# Patient Record
Sex: Male | Born: 1935 | Marital: Married | Smoking: Never smoker
Health system: Southern US, Community
[De-identification: ages and names within clinical notes are randomized; demographics above are authoritative.]

## PROBLEM LIST (undated history)

## (undated) DIAGNOSIS — Z9841 Cataract extraction status, right eye: Secondary | ICD-10-CM

## (undated) DIAGNOSIS — T8859XA Other complications of anesthesia, initial encounter: Secondary | ICD-10-CM

## (undated) DIAGNOSIS — N183 Chronic kidney disease, stage 3 unspecified: Secondary | ICD-10-CM

## (undated) DIAGNOSIS — K5792 Diverticulitis of intestine, part unspecified, without perforation or abscess without bleeding: Secondary | ICD-10-CM

## (undated) DIAGNOSIS — F0390 Unspecified dementia without behavioral disturbance: Secondary | ICD-10-CM

## (undated) DIAGNOSIS — I700 Atherosclerosis of aorta: Secondary | ICD-10-CM

## (undated) DIAGNOSIS — I714 Abdominal aortic aneurysm, without rupture, unspecified: Secondary | ICD-10-CM

## (undated) DIAGNOSIS — R112 Nausea with vomiting, unspecified: Secondary | ICD-10-CM

## (undated) DIAGNOSIS — Z9889 Other specified postprocedural states: Secondary | ICD-10-CM

## (undated) DIAGNOSIS — I739 Peripheral vascular disease, unspecified: Secondary | ICD-10-CM

## (undated) DIAGNOSIS — N289 Disorder of kidney and ureter, unspecified: Secondary | ICD-10-CM

## (undated) DIAGNOSIS — E785 Hyperlipidemia, unspecified: Secondary | ICD-10-CM

## (undated) DIAGNOSIS — N400 Enlarged prostate without lower urinary tract symptoms: Secondary | ICD-10-CM

## (undated) DIAGNOSIS — H9193 Unspecified hearing loss, bilateral: Secondary | ICD-10-CM

## (undated) DIAGNOSIS — A419 Sepsis, unspecified organism: Secondary | ICD-10-CM

## (undated) DIAGNOSIS — R339 Retention of urine, unspecified: Secondary | ICD-10-CM

## (undated) DIAGNOSIS — M109 Gout, unspecified: Secondary | ICD-10-CM

## (undated) DIAGNOSIS — I10 Essential (primary) hypertension: Secondary | ICD-10-CM

## (undated) HISTORY — PX: CATARACT EXTRACTION W/ INTRAOCULAR LENS IMPLANT: SHX1309

## (undated) HISTORY — PX: COARCTATION OF AORTA REPAIR: SHX261

---

## 2014-01-02 ENCOUNTER — Other Ambulatory Visit: Payer: Self-pay | Admitting: Family Medicine

## 2014-01-02 DIAGNOSIS — L97319 Non-pressure chronic ulcer of right ankle with unspecified severity: Secondary | ICD-10-CM

## 2014-01-08 ENCOUNTER — Ambulatory Visit
Admission: RE | Admit: 2014-01-08 | Discharge: 2014-01-08 | Disposition: A | Discharge status: Home / Self Care | Payer: Medicare HMO | Source: Ambulatory Visit | Attending: Family Medicine | Admitting: Family Medicine

## 2014-01-08 DIAGNOSIS — L97319 Non-pressure chronic ulcer of right ankle with unspecified severity: Secondary | ICD-10-CM

## 2014-01-23 ENCOUNTER — Encounter (HOSPITAL_BASED_OUTPATIENT_CLINIC_OR_DEPARTMENT_OTHER): Payer: Medicare HMO | Attending: General Surgery

## 2014-01-23 DIAGNOSIS — M109 Gout, unspecified: Secondary | ICD-10-CM | POA: Insufficient documentation

## 2014-01-23 DIAGNOSIS — L97319 Non-pressure chronic ulcer of right ankle with unspecified severity: Secondary | ICD-10-CM | POA: Diagnosis not present

## 2014-01-24 NOTE — H&P (Signed)
Adrian Aguilar:  Adrian Aguilar              ACCOUNT NO.:  [id number]  MEDICAL RECORD NO.:  [id number]  LOCATION:  FOOT                         FACILITY:  [facility]  PHYSICIAN:  Joanne Gaveloy Zivah Mayr, M.D.        DATE OF BIRTH:  04-20-1932  DATE OF ADMISSION:  01/23/2014 DATE OF DISCHARGE:                             HISTORY & PHYSICAL   CHIEF COMPLAINT:  Wound on right ankle.  HISTORY OF PRESENT ILLNESS:  This patient had a biopsy of a wound several months ago done by podiatrist and this has failed to heal.  PAST MEDICAL HISTORY:  Significant that the patient has gout and has probable peripheral vascular disease with nonpalpable pulses. Significant for hypertension, high cholesterol, epistaxis, gout, and erectile dysfunction.  PAST SURGICAL HISTORY:  None.  SOCIAL HISTORY:  Cigarettes none.  Alcohol none.  ALLERGIES:  None.  MEDICATIONS:  Lisinopril, hydrochlorothiazide, colchicine, allopurinol, which he has stopped taking.  REVIEW OF SYSTEMS:  As above.  PHYSICAL EXAMINATION:  VITAL SIGNS:  Temperature 97.7, pulse 72, respirations 16, blood pressure 151/60. GENERAL APPEARANCE:  Well developed, well nourished, in no distress. CHEST:  Clear. HEART:  Regular rhythm. EXTREMITIES:  Examination of the lower extremity reveals a lateral malleolus wound 1.3 x 1.3.  This wound is covered with an eschar.  After the eschar was removed, the patient has a tophus and a yellow liquid discharge, which was cultured and a piece of tissue was sent for biopsy.  IMPRESSION:  Tophaceous gout with ulcer, right lateral malleolus.  PLAN:  We will start with Santyl and Hydrogel.  See him in 7 days.     Joanne Gaveloy Elohim Brune, M.D.     RA/MEDQ  D:  01/23/2014  T:  01/24/2014  Job:  [id number]

## 2014-01-30 DIAGNOSIS — M109 Gout, unspecified: Secondary | ICD-10-CM | POA: Diagnosis not present

## 2014-01-30 DIAGNOSIS — L97319 Non-pressure chronic ulcer of right ankle with unspecified severity: Secondary | ICD-10-CM | POA: Diagnosis not present

## 2014-02-13 ENCOUNTER — Encounter (HOSPITAL_BASED_OUTPATIENT_CLINIC_OR_DEPARTMENT_OTHER): Payer: Medicare HMO | Attending: General Surgery

## 2014-03-28 DIAGNOSIS — I714 Abdominal aortic aneurysm, without rupture, unspecified: Secondary | ICD-10-CM | POA: Insufficient documentation

## 2014-03-28 HISTORY — PX: OTHER SURGICAL HISTORY: SHX169

## 2014-04-18 HISTORY — PX: FEMORAL-POPLITEAL BYPASS GRAFT: SHX937

## 2014-09-21 DIAGNOSIS — E785 Hyperlipidemia, unspecified: Secondary | ICD-10-CM | POA: Insufficient documentation

## 2017-12-10 DIAGNOSIS — H9193 Unspecified hearing loss, bilateral: Secondary | ICD-10-CM | POA: Insufficient documentation

## 2021-03-03 DIAGNOSIS — F03B0 Unspecified dementia, moderate, without behavioral disturbance, psychotic disturbance, mood disturbance, and anxiety: Secondary | ICD-10-CM | POA: Insufficient documentation

## 2023-04-07 DIAGNOSIS — K8011 Calculus of gallbladder with chronic cholecystitis with obstruction: Secondary | ICD-10-CM

## 2023-04-07 HISTORY — DX: Calculus of gallbladder with chronic cholecystitis with obstruction: K80.11

## 2023-04-17 ENCOUNTER — Emergency Department: Payer: Medicare Other

## 2023-04-17 ENCOUNTER — Inpatient Hospital Stay
Admission: EM | Admit: 2023-04-17 | Discharge: 2023-04-22 | DRG: 391 | Disposition: A | Discharge status: Skilled Nursing Facility | Payer: Medicare Other | Attending: Internal Medicine | Admitting: Internal Medicine

## 2023-04-17 ENCOUNTER — Other Ambulatory Visit: Payer: Self-pay

## 2023-04-17 DIAGNOSIS — N179 Acute kidney failure, unspecified: Secondary | ICD-10-CM | POA: Diagnosis not present

## 2023-04-17 DIAGNOSIS — R339 Retention of urine, unspecified: Secondary | ICD-10-CM | POA: Diagnosis not present

## 2023-04-17 DIAGNOSIS — R4182 Altered mental status, unspecified: Principal | ICD-10-CM | POA: Diagnosis present

## 2023-04-17 DIAGNOSIS — E871 Hypo-osmolality and hyponatremia: Secondary | ICD-10-CM | POA: Diagnosis present

## 2023-04-17 DIAGNOSIS — F0390 Unspecified dementia without behavioral disturbance: Secondary | ICD-10-CM | POA: Diagnosis present

## 2023-04-17 DIAGNOSIS — Z833 Family history of diabetes mellitus: Secondary | ICD-10-CM | POA: Diagnosis not present

## 2023-04-17 DIAGNOSIS — D72829 Elevated white blood cell count, unspecified: Secondary | ICD-10-CM | POA: Insufficient documentation

## 2023-04-17 DIAGNOSIS — Z1152 Encounter for screening for COVID-19: Secondary | ICD-10-CM

## 2023-04-17 DIAGNOSIS — I714 Abdominal aortic aneurysm, without rupture, unspecified: Secondary | ICD-10-CM | POA: Insufficient documentation

## 2023-04-17 DIAGNOSIS — Z87440 Personal history of urinary (tract) infections: Secondary | ICD-10-CM | POA: Diagnosis not present

## 2023-04-17 DIAGNOSIS — Z9841 Cataract extraction status, right eye: Secondary | ICD-10-CM

## 2023-04-17 DIAGNOSIS — G9341 Metabolic encephalopathy: Secondary | ICD-10-CM | POA: Diagnosis present

## 2023-04-17 DIAGNOSIS — I10 Essential (primary) hypertension: Secondary | ICD-10-CM | POA: Insufficient documentation

## 2023-04-17 DIAGNOSIS — I739 Peripheral vascular disease, unspecified: Secondary | ICD-10-CM | POA: Insufficient documentation

## 2023-04-17 DIAGNOSIS — Z7982 Long term (current) use of aspirin: Secondary | ICD-10-CM | POA: Diagnosis not present

## 2023-04-17 DIAGNOSIS — K5900 Constipation, unspecified: Secondary | ICD-10-CM | POA: Diagnosis not present

## 2023-04-17 DIAGNOSIS — Z95820 Peripheral vascular angioplasty status with implants and grafts: Secondary | ICD-10-CM | POA: Diagnosis not present

## 2023-04-17 DIAGNOSIS — N1832 Chronic kidney disease, stage 3b: Secondary | ICD-10-CM | POA: Insufficient documentation

## 2023-04-17 DIAGNOSIS — I129 Hypertensive chronic kidney disease with stage 1 through stage 4 chronic kidney disease, or unspecified chronic kidney disease: Secondary | ICD-10-CM | POA: Diagnosis present

## 2023-04-17 DIAGNOSIS — Z9842 Cataract extraction status, left eye: Secondary | ICD-10-CM

## 2023-04-17 DIAGNOSIS — Z961 Presence of intraocular lens: Secondary | ICD-10-CM | POA: Diagnosis present

## 2023-04-17 DIAGNOSIS — F03B0 Unspecified dementia, moderate, without behavioral disturbance, psychotic disturbance, mood disturbance, and anxiety: Secondary | ICD-10-CM | POA: Insufficient documentation

## 2023-04-17 DIAGNOSIS — Z79899 Other long term (current) drug therapy: Secondary | ICD-10-CM | POA: Diagnosis not present

## 2023-04-17 HISTORY — DX: Disorder of kidney and ureter, unspecified: N28.9

## 2023-04-17 HISTORY — DX: Abdominal aortic aneurysm, without rupture, unspecified: I71.40

## 2023-04-17 HISTORY — DX: Peripheral vascular disease, unspecified: I73.9

## 2023-04-17 HISTORY — DX: Essential (primary) hypertension: I10

## 2023-04-17 LAB — URINALYSIS, W/ REFLEX TO CULTURE (INFECTION SUSPECTED)
Bacteria, UA: NONE SEEN
Bilirubin Urine: NEGATIVE
Glucose, UA: NEGATIVE mg/dL
Hgb urine dipstick: NEGATIVE
Ketones, ur: NEGATIVE mg/dL
Leukocytes,Ua: NEGATIVE
Nitrite: NEGATIVE
Protein, ur: NEGATIVE mg/dL
Specific Gravity, Urine: 1.011 (ref 1.005–1.030)
Squamous Epithelial / HPF: 0 /[HPF] (ref 0–5)
pH: 5 (ref 5.0–8.0)

## 2023-04-17 LAB — COMPREHENSIVE METABOLIC PANEL
ALT: 21 U/L (ref 0–44)
AST: 27 U/L (ref 15–41)
Albumin: 4.8 g/dL (ref 3.5–5.0)
Alkaline Phosphatase: 85 U/L (ref 38–126)
Anion gap: 10 (ref 5–15)
BUN: 29 mg/dL — ABNORMAL HIGH (ref 8–23)
CO2: 22 mmol/L (ref 22–32)
Calcium: 9.7 mg/dL (ref 8.9–10.3)
Chloride: 99 mmol/L (ref 98–111)
Creatinine, Ser: 1.4 mg/dL — ABNORMAL HIGH (ref 0.61–1.24)
GFR, Estimated: 48 mL/min — ABNORMAL LOW (ref >60)
Glucose, Bld: 110 mg/dL — ABNORMAL HIGH (ref 70–99)
Potassium: 3.7 mmol/L (ref 3.5–5.1)
Sodium: 131 mmol/L — ABNORMAL LOW (ref 135–145)
Total Bilirubin: 0.9 mg/dL (ref 0.0–1.2)
Total Protein: 8.2 g/dL — ABNORMAL HIGH (ref 6.5–8.1)

## 2023-04-17 LAB — CBC WITH DIFFERENTIAL/PLATELET
Abs Immature Granulocytes: 0.08 10*3/uL — ABNORMAL HIGH (ref 0.00–0.07)
Basophils Absolute: 0.1 10*3/uL (ref 0.0–0.1)
Basophils Relative: 0 %
Eosinophils Absolute: 0.2 10*3/uL (ref 0.0–0.5)
Eosinophils Relative: 1 %
HCT: 41.4 % (ref 39.0–52.0)
Hemoglobin: 14.6 g/dL (ref 13.0–17.0)
Immature Granulocytes: 1 %
Lymphocytes Relative: 9 %
Lymphs Abs: 1.5 10*3/uL (ref 0.7–4.0)
MCH: 30 pg (ref 26.0–34.0)
MCHC: 35.3 g/dL (ref 30.0–36.0)
MCV: 85.2 fL (ref 80.0–100.0)
Monocytes Absolute: 1.1 10*3/uL — ABNORMAL HIGH (ref 0.1–1.0)
Monocytes Relative: 6 %
Neutro Abs: 13.8 10*3/uL — ABNORMAL HIGH (ref 1.7–7.7)
Neutrophils Relative %: 83 %
Platelets: 266 10*3/uL (ref 150–400)
RBC: 4.86 MIL/uL (ref 4.22–5.81)
RDW: 12.6 % (ref 11.5–15.5)
WBC: 16.6 10*3/uL — ABNORMAL HIGH (ref 4.0–10.5)
nRBC: 0 % (ref 0.0–0.2)

## 2023-04-17 LAB — LACTIC ACID, PLASMA
Lactic Acid, Venous: 1.7 mmol/L (ref 0.5–1.9)
Lactic Acid, Venous: 1.7 mmol/L (ref 0.5–1.9)

## 2023-04-17 LAB — PROTIME-INR
INR: 1 (ref 0.8–1.2)
Prothrombin Time: 13.8 s (ref 11.4–15.2)

## 2023-04-17 LAB — RESP PANEL BY RT-PCR (RSV, FLU A&B, COVID)  RVPGX2
Influenza A by PCR: NEGATIVE
Influenza B by PCR: NEGATIVE
Resp Syncytial Virus by PCR: NEGATIVE
SARS Coronavirus 2 by RT PCR: NEGATIVE

## 2023-04-17 LAB — PROCALCITONIN: Procalcitonin: <0.1 ng/mL

## 2023-04-17 MED ORDER — IOHEXOL 300 MG/ML  SOLN
80.0000 mL | Freq: Once | INTRAMUSCULAR | Status: AC | PRN
  Administered 2023-04-17: 80 mL via INTRAVENOUS

## 2023-04-17 MED ORDER — VANCOMYCIN HCL IN DEXTROSE 1-5 GM/200ML-% IV SOLN
1000.0000 mg | Freq: Once | INTRAVENOUS | Status: AC
  Administered 2023-04-17: 1000 mg via INTRAVENOUS
  Filled 2023-04-17: qty 200

## 2023-04-17 MED ORDER — MELATONIN 5 MG PO TABS
5.0000 mg | ORAL_TABLET | Freq: Every day | ORAL | Status: DC
  Administered 2023-04-17 – 2023-04-21 (×2): 5 mg via ORAL
  Filled 2023-04-17 (×4): qty 1

## 2023-04-17 MED ORDER — ACETAMINOPHEN 650 MG RE SUPP: 650.0000 mg | Freq: Four times a day (QID) | RECTAL | Status: DC | PRN

## 2023-04-17 MED ORDER — VANCOMYCIN HCL 750 MG IV SOLR
750.0000 mg | Freq: Once | INTRAVENOUS | Status: AC
  Administered 2023-04-17: 750 mg via INTRAVENOUS
  Filled 2023-04-17 (×2): qty 15

## 2023-04-17 MED ORDER — HEPARIN SODIUM (PORCINE) 5000 UNIT/ML IJ SOLN
5000.0000 [IU] | Freq: Three times a day (TID) | INTRAMUSCULAR | Status: DC
  Administered 2023-04-17 – 2023-04-22 (×14): 5000 [IU] via SUBCUTANEOUS
  Filled 2023-04-17 (×13): qty 1

## 2023-04-17 MED ORDER — ONDANSETRON HCL 4 MG PO TABS: 4.0000 mg | ORAL_TABLET | Freq: Four times a day (QID) | ORAL | Status: DC | PRN

## 2023-04-17 MED ORDER — ONDANSETRON HCL 4 MG/2ML IJ SOLN: 4.0000 mg | Freq: Four times a day (QID) | INTRAMUSCULAR | Status: DC | PRN

## 2023-04-17 MED ORDER — HYDRALAZINE HCL 20 MG/ML IJ SOLN
5.0000 mg | Freq: Four times a day (QID) | INTRAMUSCULAR | Status: DC | PRN
  Administered 2023-04-19 – 2023-04-20 (×3): 5 mg via INTRAVENOUS
  Filled 2023-04-17 (×3): qty 1

## 2023-04-17 MED ORDER — SODIUM CHLORIDE 0.9 % IV SOLN
2.0000 g | Freq: Two times a day (BID) | INTRAVENOUS | Status: DC
  Administered 2023-04-18: 2 g via INTRAVENOUS
  Filled 2023-04-17: qty 12.5

## 2023-04-17 MED ORDER — MAGNESIUM CITRATE PO SOLN
1.0000 | Freq: Once | ORAL | Status: AC
  Administered 2023-04-17: 1 via ORAL
  Filled 2023-04-17 (×2): qty 296

## 2023-04-17 MED ORDER — LIDOCAINE VISCOUS HCL 2 % MT SOLN
15.0000 mL | Freq: Once | OROMUCOSAL | Status: AC
  Administered 2023-04-17: 15 mL via OROMUCOSAL
  Filled 2023-04-17: qty 15

## 2023-04-17 MED ORDER — HALOPERIDOL LACTATE 5 MG/ML IJ SOLN: 2.0000 mg | Freq: Four times a day (QID) | INTRAMUSCULAR | Status: DC | PRN

## 2023-04-17 MED ORDER — METRONIDAZOLE 500 MG/100ML IV SOLN
500.0000 mg | Freq: Two times a day (BID) | INTRAVENOUS | Status: DC
  Administered 2023-04-17 – 2023-04-18 (×2): 500 mg via INTRAVENOUS
  Filled 2023-04-17 (×2): qty 100

## 2023-04-17 MED ORDER — LORAZEPAM 2 MG/ML IJ SOLN
0.5000 mg | Freq: Four times a day (QID) | INTRAMUSCULAR | Status: DC | PRN
  Administered 2023-04-17: 0.5 mg via INTRAVENOUS
  Filled 2023-04-17: qty 1

## 2023-04-17 MED ORDER — ACETAMINOPHEN 325 MG PO TABS: 650.0000 mg | ORAL_TABLET | Freq: Four times a day (QID) | ORAL | Status: DC | PRN

## 2023-04-17 MED ORDER — VANCOMYCIN HCL IN DEXTROSE 1-5 GM/200ML-% IV SOLN: 1000.0000 mg | INTRAVENOUS | Status: DC

## 2023-04-17 MED ORDER — LACTATED RINGERS IV BOLUS
1000.0000 mL | Freq: Once | INTRAVENOUS | Status: AC
  Administered 2023-04-17: 1000 mL via INTRAVENOUS

## 2023-04-17 MED ORDER — LORAZEPAM 2 MG/ML IJ SOLN
1.0000 mg | Freq: Once | INTRAMUSCULAR | Status: AC
  Administered 2023-04-17: 1 mg via INTRAVENOUS
  Filled 2023-04-17: qty 1

## 2023-04-17 MED ORDER — SODIUM CHLORIDE 0.9 % IV SOLN
2.0000 g | Freq: Once | INTRAVENOUS | Status: AC
  Administered 2023-04-17: 2 g via INTRAVENOUS
  Filled 2023-04-17: qty 12.5

## 2023-04-17 NOTE — ED Provider Notes (Signed)
 [facility] Provider Note    Event Date/Time   First MD Initiated Contact with Patient 04/17/23 1501     (approximate)   History   Altered Mental Status and Fall   HPI  Adrian Aguilar is a 89 y.o. male  who presents to the emergency department today because of concern for weakness, falls and altered mental status. Patient is unable to give any significant history. History is obtained from wife at bedside. She states that the patient had been in his normal state of health in the morning however then developed diarrhea. The wife went to the pharmacy to pick up immodium and when she came back she noticed he was weaker. Because of the weakness he fell two times. He did not pass out. The patient had not had any cough. No fevers. Wife says he was septic once in the past and also has history of urinary tract infections.     Physical Exam   Triage Vital Signs: ED Triage Vitals  Encounter Vitals Group     BP 04/17/23 1437 (!) 160/80     Systolic BP Percentile --      Diastolic BP Percentile --      Pulse Rate 04/17/23 1437 94     Resp 04/17/23 1437 (!) 26     Temp 04/17/23 1437 98 F (36.7 C)     Temp Source 04/17/23 1437 Oral     SpO2 04/17/23 1437 99 %     Weight 04/17/23 1437 183 lb (83 kg)     Height --      Head Circumference --      Peak Flow --      Pain Score 04/17/23 1428 0     Pain Loc --      Pain Education --      Exclude from Growth Chart --     Most recent vital signs: Vitals:   04/17/23 1437  BP: (!) 160/80  Pulse: 94  Resp: (!) 26  Temp: 98 F (36.7 C)  SpO2: 99%   General: Awake, alert, oriented. CV:  Good peripheral perfusion. Regular rate and rhythm Resp:  Normal effort. Tachypnea.  Abd:  Distended. Non tender.   ED Results / Procedures / Treatments   Labs (all labs ordered are listed, but only abnormal results are displayed) Labs Reviewed  COMPREHENSIVE METABOLIC PANEL - Abnormal; Notable for the following components:       Result Value   Sodium 131 (*)    Glucose, Bld 110 (*)    BUN 29 (*)    Creatinine, Ser 1.40 (*)    Total Protein 8.2 (*)    GFR, Estimated 48 (*)    All other components within normal limits  CBC WITH DIFFERENTIAL/PLATELET - Abnormal; Notable for the following components:   WBC 16.6 (*)    Neutro Abs 13.8 (*)    Monocytes Absolute 1.1 (*)    Abs Immature Granulocytes 0.08 (*)    All other components within normal limits  URINALYSIS, W/ REFLEX TO CULTURE (INFECTION SUSPECTED) - Abnormal; Notable for the following components:   Color, Urine YELLOW (*)    APPearance CLEAR (*)    All other components within normal limits  CULTURE, BLOOD (ROUTINE X 2)  CULTURE, BLOOD (ROUTINE X 2)  RESP PANEL BY RT-PCR (RSV, FLU A&B, COVID)  [id number]  LACTIC ACID, PLASMA  LACTIC ACID, PLASMA  PROTIME-INR  BASIC METABOLIC PANEL  CBC  PROCALCITONIN     EKG  LILLETTE Guadalupe Eagles, attending physician, personally viewed and interpreted this EKG  EKG Time: 1429 Rate: 95 Rhythm: sinus rhythm Axis: left axis deviation Intervals: qtc 450 QRS: LVH ST changes: no st elevation Impression: abnormal ekg   RADIOLOGY I independently interpreted and visualized the CXR. My interpretation: No pneumonia. Radiology interpretation:  IMPRESSION:  1. 8 mm density in the left lung base, possibly a true  intrapulmonary nodule. Chest CT is recommended for further  characterization.  2.  Aortic Atherosclerosis (ICD10-I70.0).   I independently interpreted and visualized the CT head/cervical spine. My interpretation: No ICH. No fracture Radiology interpretation:  IMPRESSION:  1. No acute intracranial findings.  2. Atrophy and white matter microvascular disease.  3. No cervical spine fracture.  4. Mild multilevel disc osteophytic disease.   I independently interpreted and visualized the CT abd/pel. My interpretation: No free air Radiology interpretation:  IMPRESSION:  1. Moderate stool distends the  rectum to 7.2 cm and transverse  diameter. There is some wall thickening and gas around the stool  ball without significant inflammatory change. Stool impaction may be  the source of the patient's pain.  2. Descending and sigmoid diverticulosis without diverticulitis.  3. Aortic bi-femoral stent graft in place.  4. Grade 1 degenerative anterolisthesis at L4-5.  5. Moderate facet hypertrophy bilaterally at L4-5 and L5-S1.  6.  Aortic Atherosclerosis (ICD10-I70.0).       PROCEDURES:  Critical Care performed: No   MEDICATIONS ORDERED IN ED: Medications - No data to display   IMPRESSION / MDM / ASSESSMENT AND PLAN / ED COURSE  I reviewed the triage vital signs and the nursing notes.                              Differential diagnosis includes, but is not limited to, dehydration, UTI, pneumonia  Patient's presentation is most consistent with acute presentation with potential threat to life or bodily function.   The patient is on the cardiac monitor to evaluate for evidence of arrhythmia and/or significant heart rate changes.  Patient presents to the emergency department today because of concern for weakness, falls and AMS. Patient unable to give history. On exam stomach was distended. Afebrile. Nursing staff performed bladder scan which revealed >700ccs. Did ask that foley catheter be placed. WBC elevated. Given concern for possible infection IVFs and abx were ordered.  CT head, cervical spine without any acute abnormalities.  Did obtain a CT abdomen pelvis which showed possible fecal impaction but no other concerning abnormalities.  I did attempt disimpaction however was not able to reach the stool at this time.  Given continued altered mental status I did discuss with Dr. Sherre with the hospitalist service for evaluation and admission.      FINAL CLINICAL IMPRESSION(S) / ED DIAGNOSES   Final diagnoses:  Altered mental status, unspecified altered mental status type      Note:  This document was prepared using Dragon voice recognition software and may include unintentional dictation errors.    Eagles Guadalupe, MD 04/17/23 1901

## 2023-04-17 NOTE — ED Notes (Signed)
 Collected Lactic acid at later time to allow Patient to obtain CT's and too allow the LR fluid bolus to complete.

## 2023-04-17 NOTE — ED Notes (Signed)
 Patient transported to CT

## 2023-04-17 NOTE — ED Triage Notes (Addendum)
 Pt to ED from home for fall and AMS via [facility] Diarrhea past 3 days. Had mechanical fall this AM per wife and did not hit head but did scratch R elbow.  EMS VS:CBG 157, 189f temporal, HR 104, RR 35, etco2 14, 222/114  Wife told EMS that pt very confused. Abdomen distended, tachypneic.   Triage VS: oral temp is 98.0, RR 24-26, 100% on RA, 160/80.   Oriented to self only. Unable to get good history from pt.    Obtained PMH from wife who is at bedside.

## 2023-04-17 NOTE — Progress Notes (Signed)
 Pharmacy Antibiotic Note  Adrian Aguilar is a 89 y.o. male admitted on 04/17/2023 with  an infection of unknown source .  On presentation, patient reported increased altered mental status and increased falling. In the ED, patient was afebrile, RR 26, HR 94, and BP 160/80. Lactic acid was 1.7. UA negative for leukocytes and nitrites. Pharmacy has been consulted for vancomycin  and cefepime  dosing x 7 days.  S/P Vancomycin  1 g IV x 1, metronidazole  500 mg IV x 1, and cefepime  2 g IV x 1  Plan: Give vancomycin  750 mg IV x1 to complete loading dose followed by 1000 mg IV Q24H. Goal AUC 400-550. Expected AUC: 469.3 Expected Css min: 12.8 SCr used: 1.4  Weight used: IBW, Vd used: 0.72 (BMI 26.3) Start cefepime  2 g IV Q12H Patient is also on metronidazole  500 mg IV Q12H Continue to monitor renal function and follow culture results   Weight: 83 kg (183 lb)  Temp (24hrs), Avg:98.4 F (36.9 C), Min:98 F (36.7 C), Max:98.7 F (37.1 C)  Recent Labs  Lab 04/17/23 1431 04/17/23 1738  WBC 16.6*  --   CREATININE 1.40*  --   LATICACIDVEN 1.7 1.7    CrCl cannot be calculated (Unknown ideal weight.).    Allergies  Allergen Reactions   Shellfish Allergy Other (See Comments)    According to wife. N/V only    Antimicrobials this admission: 1/11 Vancomycin  >>  1/11 Cefepime  >>  1/11 Metronidazole >>   Microbiology results: 1/11 BCx: IP   Thank you for allowing pharmacy to be a part of this patient's care.  Lum VEAR Mania, PharmD Clinical Pharmacist 04/17/2023 6:54 PM

## 2023-04-17 NOTE — Assessment & Plan Note (Addendum)
 Query obstipation, failed attempt at disimpaction per EDP 1 bottle of mag citrate solution ordered on admission AM team to consider enema if patient still does not have a bowel movement Strict I's and O's

## 2023-04-17 NOTE — Assessment & Plan Note (Signed)
 Without rupture

## 2023-04-17 NOTE — ED Notes (Signed)
 Patient attempting to climb out of bed. Patient more confused than when patient arrived at [facility] ED. Wife at bedside. Derrill Kay, MD notified.

## 2023-04-17 NOTE — Assessment & Plan Note (Signed)
 Etiology workup in progress Blood cultures x 2 are in process Check procalcitonin on admission Respiratory panel is in process at the time of this dictation Continue cefepime and vancomycin per pharmacy

## 2023-04-17 NOTE — Assessment & Plan Note (Signed)
 At baseline

## 2023-04-17 NOTE — ED Notes (Signed)
 Alerted Cox, DO, to patients increase aggravation and attempting to get out of the bed to pee, RN attempts to re-orient patient stating Mr. Formica you have a foley catheter in, you can go ahead and pee. Wife attempts to re-orient patient as well. Patient still trying to get out of the bed. See new orders.

## 2023-04-17 NOTE — Hospital Course (Signed)
 Mr. Delmus Warwick is a 89 year old male with history of CKD 3B, PAD status post aortic bifurcation stenting, hypertension, who presents emergency department for chief concerns of altered mental status and increasing fall.  Vitals in the ED showed temperature of 98, respiration rate 26, heart rate of 94, blood pressure 160/80, SpO2 99% on room air.  Serum sodium is 131, potassium 3.7, chloride 99, bicarb 22, BUN of 29, serum creatinine 1.40, EGFR 48, nonfasting blood glucose 110, WBC 16.6, hemoglobin 14.6, platelets of 266.  Lactic acid is 1.7 and on repeat was also 1.7.  UA was negative for leukocytes and nitrates.  ED treatment: Cefepime  and vancomycin  per pharmacy.  EDP attempted disimpaction and this was unsuccessful as the stool is unreachable.

## 2023-04-17 NOTE — Assessment & Plan Note (Signed)
 Suspect secondary to reactive Check cbc in the AM

## 2023-04-17 NOTE — Assessment & Plan Note (Signed)
 Hydralazine 5 mg IV every 6 hours as needed for SBP > 165, 5 days ordered

## 2023-04-17 NOTE — H&P (Signed)
 History and Physical   Adrian Aguilar:[id number] DOB: [birth year] DOA: 04/17/2023  PCP: Adrian Lamar Flavors, MD  Patient coming from: home via EMS  I have personally briefly reviewed patient's old medical records in [facility] EMR.  Chief Concern: Altered mental status  HPI: [name] is a 89 year old male with history of CKD 3B, PAD status post aortic bifurcation stenting, hypertension, who presents emergency department for chief concerns of altered mental status and increasing fall.  Vitals in the ED showed temperature of 98, respiration rate 26, heart rate of 94, blood pressure 160/80, SpO2 99% on room air.  Serum sodium is 131, potassium 3.7, chloride 99, bicarb 22, BUN of 29, serum creatinine 1.40, EGFR 48, nonfasting blood glucose 110, WBC 16.6, hemoglobin 14.6, platelets of 266.  Lactic acid is 1.7 and on repeat was also 1.7.  UA was negative for leukocytes and nitrates.  ED treatment: Cefepime  and vancomycin  per pharmacy.  EDP attempted disimpaction and this was unsuccessful as the stool is unreachable. ----------------------------------- At bedside, patient at able to tell me his name, birth day, and current location of hospital. He was not able to tell me his age.  Patient states that he is at bedside because the doctor thought he would be a good idea today.  Spouse at bedside states that patient does have worsening confusion at night.  She denies vomiting, fever, cough.  She reports that he did have a bowel movement this morning, and it was mostly loose/watery with some solid stools and there.  Social history: He lives at home with his wife.  He does not smoke or use tobacco products.  He does not have a history of recreational drug use.  He previously drank socially and he has not had an EtOH drink in about 3 years per spouse.  Patient is retired.  ROS: Unable to complete due to patient history of dementia and with altered mentation on admission  ED Course:  Discussed with the EDP, patient requiring hospitalization for chief concerns of altered mental status and following.  Assessment/Plan  Principal Problem:   Altered mental status Active Problems:   Essential hypertension   CKD stage 3b, GFR 30-44 ml/min (HCC)   Dementia (HCC)   Abdominal aortic aneurysm (AAA) (HCC)   PVD (peripheral vascular disease) (HCC)   Constipation   Leukocytosis   Assessment and Plan:  * Altered mental status Etiology workup in progress Blood cultures x 2 are in process Check procalcitonin on admission Respiratory panel is in process at the time of this dictation Continue cefepime  and vancomycin  per pharmacy  Leukocytosis Suspect secondary to reactive Check cbc in the AM  Constipation Query obstipation, failed attempt at disimpaction per EDP 1 bottle of mag citrate solution ordered on admission AM team to consider enema if patient still does not have a bowel movement Strict I's and O's  PVD (peripheral vascular disease) (HCC) Status post aortic bifurcation stenting  Abdominal aortic aneurysm (AAA) (HCC) Without rupture  CKD stage 3b, GFR 30-44 ml/min (HCC) At baseline  Essential hypertension Hydralazine  5 mg IV every 6 hours as needed for SBP > 165, 5 days ordered  Chart reviewed.   DVT prophylaxis: Heparin  5000 units subcutaneous every 8 hours Code Status: full code Diet: Number Family Communication: Updated spouse at bedside Disposition Plan: Pending clinical course Consults called: None at this time Admission status: Telemetry medical, inpatient  Past Medical History:  Diagnosis Date   AAA (abdominal aortic aneurysm) (HCC)  Hypertension    PAD (peripheral artery disease) (HCC)    PAD with stent placed to artery in R leg   Renal disorder    stage 3 CKD   Past Surgical History:  Procedure Laterality Date   CATARACT EXTRACTION W/ INTRAOCULAR LENS IMPLANT Bilateral    Social History:  reports that he has never smoked. He has  never used smokeless tobacco. He reports that he does not currently use alcohol. He reports that he does not use drugs.  Allergies  Allergen Reactions   Meat Extract Itching, Other (See Comments) and Rash    Red meat   Rosuvastatin Other (See Comments)    SEVERE LEG PAIN   Allopurinol Other (See Comments)    Lethargy   Propofol  Other (See Comments)    Other   Shellfish Allergy Other (See Comments)    According to wife. N/V only   Family History  Problem Relation Age of Onset   Diabetes Brother     Family history: Family history reviewed and not pertinent.  Prior to Admission medications   Medication Sig Start Date End Date Taking? Authorizing Provider  aspirin EC 81 MG tablet Take 81 mg by mouth daily. Swallow whole.   Yes [provider]  colchicine  0.6 MG tablet Take 0.6 mg by mouth daily.   Yes [provider]  febuxostat  (ULORIC ) 40 MG tablet Take 40 mg by mouth daily.   Yes [provider]  lisinopril  (ZESTRIL ) 20 MG tablet Take 20 mg by mouth daily.   Yes [provider]   Physical Exam: Vitals:   04/17/23 1437 04/17/23 1554 04/17/23 1828 04/17/23 1830  BP: (!) 160/80 (!) 179/79  (!) 167/60  Pulse: 94 82  68  Resp: (!) 26 (!) 22  16  Temp: 98 F (36.7 C)  98.7 F (37.1 C)   TempSrc: Oral  Oral   SpO2: 99% 100%  97%  Weight: 83 kg      Constitutional: appears age-appropriate, frail Eyes: PERRL, lids and conjunctivae normal ENMT: Mucous membranes are moist. Posterior pharynx clear of any exudate or lesions. Age-appropriate dentition. Hearing appropriate Neck: normal, supple, no masses, no thyromegaly Respiratory: clear to auscultation bilaterally, no wheezing, no crackles. Normal respiratory effort. No accessory muscle use.  Cardiovascular: Regular rate and rhythm, no murmurs / rubs / gallops. No extremity edema. 2+ pedal pulses. No carotid bruits.  Abdomen: Obese abdomen, no tenderness, no masses palpated, no hepatosplenomegaly.  Bowel sounds positive.  Musculoskeletal: no clubbing / cyanosis. No joint deformity upper and lower extremities. Good ROM, no contractures, no atrophy. Normal muscle tone.  Skin: no rashes, lesions, ulcers. No induration Neurologic: Sensation intact. Strength 5/5 in all 4.  Psychiatric: Normal judgment and insight. Alert and oriented x 3. Normal mood.   EKG: independently reviewed, showing sinus rhythm with rate of 95, QTc 450  Chest x-ray on Admission: I personally reviewed and I agree with radiologist reading as below.  CT ABDOMEN PELVIS W CONTRAST Result Date: 04/17/2023 CLINICAL DATA:  Diarrhea.  Abdominal pain. EXAM: CT ABDOMEN AND PELVIS WITH CONTRAST TECHNIQUE: Multidetector CT imaging of the abdomen and pelvis was performed using the standard protocol following bolus administration of intravenous contrast. RADIATION DOSE REDUCTION: This exam was performed according to the departmental dose-optimization program which includes automated exposure control, adjustment of the mA and/or kV according to patient size and/or use of iterative reconstruction technique. CONTRAST:  80mL OMNIPAQUE  IOHEXOL  300 MG/ML  SOLN COMPARISON:  None Available. FINDINGS: Lower chest: Mild  dependent atelectasis is present both lungs. Lungs otherwise clear. The heart size is normal. Artery calcifications are present. No significant pleural or pericardial effusion is present. Hepatobiliary: No focal liver abnormality is seen. No gallstones, gallbladder wall thickening, or biliary dilatation. Pancreas: Unremarkable. No pancreatic ductal dilatation or surrounding inflammatory changes. Spleen: Normal in size without focal abnormality. Adrenals/Urinary Tract: The adrenal glands are normal bilaterally. A simple cyst at the upper pole of the right kidney measures 3.2 cm. A posterior simple cyst measures 10 mm. Smaller cysts are present at the lower pole of the left kidney. No follow-up imaging is recommended no stone or solid mass  lesion is present. No obstruction is present. The ureters are within normal limits. A Foley catheter is present within the urinary bladder. Stomach/Bowel: The stomach and duodenum are within normal limits. Small bowel is unremarkable. Terminal ileum is within normal limits. The appendix is visualized and normal. The ascending and transverse colon are normal. Diverticular changes are present in the distal descending and at the sigmoid colon. No focal inflammatory changes are present to suggest diverticulitis. Moderate stool distends the rectum to 7.2 cm and transverse diameter. There is some wall thickening and gas around the stool ball without significant inflammatory change. No proximal obstruction is present. Vascular/Lymphatic: An aortic bi-femoral stent graft is in place. Atherosclerotic calcifications are present in the more proximal aorta and branch vessels. Calcifications are present in the external iliac arteries and femoral arteries. 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 Reproductive: Prostate is unremarkable. Other: No abdominal wall hernia or abnormality. No abdominopelvic ascites. Musculoskeletal: Grade 1 degenerative anterolisthesis is present at L4-5. Moderate facet hypertrophy is present bilaterally at L4-5 and L5-S1. No focal osseous lesions are present. The bony pelvis is normal. The hips are located and normal bilaterally. IMPRESSION: 1. Moderate stool distends the rectum to 7.2 cm and transverse diameter. There is some wall thickening and gas around the stool ball without significant inflammatory change. Stool impaction may be the source of the patient's pain. 2. Descending and sigmoid diverticulosis without diverticulitis. 3. Aortic bi-femoral stent graft in place. 4. Grade 1 degenerative anterolisthesis at L4-5. 5. Moderate facet hypertrophy bilaterally at L4-5 and L5-S1. 6.  Aortic Atherosclerosis (ICD10-I70.0). Electronically Signed   By: Lonni Necessary M.D.   On: 04/17/2023 17:37   CT Head Wo  Contrast Result Date: 04/17/2023 CLINICAL DATA:  Altered mental status.  Fall EXAM: CT HEAD WITHOUT CONTRAST CT CERVICAL SPINE WITHOUT CONTRAST TECHNIQUE: Multidetector CT imaging of the head and cervical spine was performed following the standard protocol without intravenous contrast. Multiplanar CT image reconstructions of the cervical spine were also generated. RADIATION DOSE REDUCTION: This exam was performed according to the departmental dose-optimization program which includes automated exposure control, adjustment of the mA and/or kV according to patient size and/or use of iterative reconstruction technique. COMPARISON:  None Available. FINDINGS: CT HEAD FINDINGS Brain: No acute intracranial hemorrhage. No focal mass lesion. No CT evidence of acute infarction. No midline shift or mass effect. No hydrocephalus. Basilar cisterns are patent. There are periventricular and subcortical white matter hypodensities. Generalized cortical atrophy. Vascular: No hyperdense vessel or unexpected calcification. Skull: Normal. Negative for fracture or focal lesion. Sinuses/Orbits: Paranasal sinuses and mastoid air cells are clear. Orbits are clear. Other: None. CT CERVICAL SPINE FINDINGS Alignment: Normal alignment of the cervical vertebral bodies. Skull base and vertebrae: Normal craniocervical junction. No loss of vertebral body height or disc height. Normal facet articulation. No evidence of fracture. Soft tissues and spinal canal: No prevertebral  soft tissue swelling. No perispinal or epidural hematoma. Disc levels: Anterior osteophytes from C5 to C7 with mild disc space narrowing. No acute findings. Upper chest: Clear Other: None IMPRESSION: 1. No acute intracranial findings. 2. Atrophy and white matter microvascular disease. 3. No cervical spine fracture. 4. Mild multilevel disc osteophytic disease. Electronically Signed   By: Jackquline Boxer M.D.   On: 04/17/2023 16:23   CT Cervical Spine Wo Contrast Result Date:  04/17/2023 CLINICAL DATA:  Altered mental status.  Fall EXAM: CT HEAD WITHOUT CONTRAST CT CERVICAL SPINE WITHOUT CONTRAST TECHNIQUE: Multidetector CT imaging of the head and cervical spine was performed following the standard protocol without intravenous contrast. Multiplanar CT image reconstructions of the cervical spine were also generated. RADIATION DOSE REDUCTION: This exam was performed according to the departmental dose-optimization program which includes automated exposure control, adjustment of the mA and/or kV according to patient size and/or use of iterative reconstruction technique. COMPARISON:  None Available. FINDINGS: CT HEAD FINDINGS Brain: No acute intracranial hemorrhage. No focal mass lesion. No CT evidence of acute infarction. No midline shift or mass effect. No hydrocephalus. Basilar cisterns are patent. There are periventricular and subcortical white matter hypodensities. Generalized cortical atrophy. Vascular: No hyperdense vessel or unexpected calcification. Skull: Normal. Negative for fracture or focal lesion. Sinuses/Orbits: Paranasal sinuses and mastoid air cells are clear. Orbits are clear. Other: None. CT CERVICAL SPINE FINDINGS Alignment: Normal alignment of the cervical vertebral bodies. Skull base and vertebrae: Normal craniocervical junction. No loss of vertebral body height or disc height. Normal facet articulation. No evidence of fracture. Soft tissues and spinal canal: No prevertebral soft tissue swelling. No perispinal or epidural hematoma. Disc levels: Anterior osteophytes from C5 to C7 with mild disc space narrowing. No acute findings. Upper chest: Clear Other: None IMPRESSION: 1. No acute intracranial findings. 2. Atrophy and white matter microvascular disease. 3. No cervical spine fracture. 4. Mild multilevel disc osteophytic disease. Electronically Signed   By: Jackquline Boxer M.D.   On: 04/17/2023 16:23   DG Chest Port 1 View Result Date: 04/17/2023 CLINICAL DATA:   Sepsis, altered mental status EXAM: PORTABLE CHEST 1 VIEW COMPARISON:  None Available. FINDINGS: Atherosclerotic calcification of the aortic arch. The overlap of the left posterior seventh rib and inferior scapular tip, an 8 mm density is present and a true intrapulmonary nodule is not excluded. Chest CT is recommended for further characterization. The lungs appear otherwise clear. Cardiac and mediastinal margins appear normal. IMPRESSION: 1. 8 mm density in the left lung base, possibly a true intrapulmonary nodule. Chest CT is recommended for further characterization. 2.  Aortic Atherosclerosis (ICD10-I70.0). Electronically Signed   By: Ryan Salvage M.D.   On: 04/17/2023 15:47   Labs on Admission: I have personally reviewed following labs  CBC: Recent Labs  Lab 04/17/23 1431  WBC 16.6*  NEUTROABS 13.8*  HGB 14.6  HCT 41.4  MCV 85.2  PLT 266   Basic Metabolic Panel: Recent Labs  Lab 04/17/23 1431  NA 131*  K 3.7  CL 99  CO2 22  GLUCOSE 110*  BUN 29*  CREATININE 1.40*  CALCIUM 9.7   GFR: CrCl cannot be calculated (Unknown ideal weight.).  Liver Function Tests: Recent Labs  Lab 04/17/23 1431  AST 27  ALT 21  ALKPHOS 85  BILITOT 0.9  PROT 8.2*  ALBUMIN 4.8   Coagulation Profile: Recent Labs  Lab 04/17/23 1431  INR 1.0   Urine analysis:    Component Value Date/Time   COLORURINE YELLOW (  A) 04/17/2023 1448   APPEARANCEUR CLEAR (A) 04/17/2023 1448   LABSPEC 1.011 04/17/2023 1448   PHURINE 5.0 04/17/2023 1448   GLUCOSEU NEGATIVE 04/17/2023 1448   HGBUR NEGATIVE 04/17/2023 1448   BILIRUBINUR NEGATIVE 04/17/2023 1448   KETONESUR NEGATIVE 04/17/2023 1448   PROTEINUR NEGATIVE 04/17/2023 1448   NITRITE NEGATIVE 04/17/2023 1448   LEUKOCYTESUR NEGATIVE 04/17/2023 1448   This document was prepared using Dragon Voice Recognition software and may include unintentional dictation errors.  Dr. Sherre [facility]  If 7PM-7AM, please contact overnight-coverage  provider If 7AM-7PM, please contact day attending provider [email or web address]  04/17/2023, 7:17 PM

## 2023-04-17 NOTE — ED Notes (Signed)
 Cox, DO in room now

## 2023-04-17 NOTE — ED Notes (Signed)
 MD in room attempting dis-impaction.

## 2023-04-17 NOTE — Assessment & Plan Note (Signed)
 Status post aortic bifurcation stenting

## 2023-04-18 DIAGNOSIS — D72829 Elevated white blood cell count, unspecified: Secondary | ICD-10-CM | POA: Diagnosis not present

## 2023-04-18 DIAGNOSIS — K5900 Constipation, unspecified: Secondary | ICD-10-CM

## 2023-04-18 DIAGNOSIS — R4182 Altered mental status, unspecified: Secondary | ICD-10-CM | POA: Diagnosis not present

## 2023-04-18 LAB — CBC
HCT: 38.9 % — ABNORMAL LOW (ref 39.0–52.0)
Hemoglobin: 13.4 g/dL (ref 13.0–17.0)
MCH: 29.2 pg (ref 26.0–34.0)
MCHC: 34.4 g/dL (ref 30.0–36.0)
MCV: 84.7 fL (ref 80.0–100.0)
Platelets: 242 10*3/uL (ref 150–400)
RBC: 4.59 MIL/uL (ref 4.22–5.81)
RDW: 12.8 % (ref 11.5–15.5)
WBC: 9.1 10*3/uL (ref 4.0–10.5)
nRBC: 0 % (ref 0.0–0.2)

## 2023-04-18 LAB — BASIC METABOLIC PANEL
Anion gap: 7 (ref 5–15)
BUN: 26 mg/dL — ABNORMAL HIGH (ref 8–23)
CO2: 24 mmol/L (ref 22–32)
Calcium: 9.4 mg/dL (ref 8.9–10.3)
Chloride: 106 mmol/L (ref 98–111)
Creatinine, Ser: 1.41 mg/dL — ABNORMAL HIGH (ref 0.61–1.24)
GFR, Estimated: 48 mL/min — ABNORMAL LOW (ref >60)
Glucose, Bld: 106 mg/dL — ABNORMAL HIGH (ref 70–99)
Potassium: 3.5 mmol/L (ref 3.5–5.1)
Sodium: 137 mmol/L (ref 135–145)

## 2023-04-18 MED ORDER — BISACODYL 10 MG RE SUPP
10.0000 mg | Freq: Every day | RECTAL | Status: DC | PRN
  Administered 2023-04-18: 10 mg via RECTAL
  Filled 2023-04-18 (×2): qty 1

## 2023-04-18 MED ORDER — POLYETHYLENE GLYCOL 3350 17 G PO PACK: 17.0000 g | PACK | Freq: Every day | ORAL | 0 refills | Status: DC

## 2023-04-18 MED ORDER — LORAZEPAM 2 MG/ML IJ SOLN
2.0000 mg | Freq: Once | INTRAMUSCULAR | Status: AC
  Administered 2023-04-18: 2 mg via INTRAVENOUS
  Filled 2023-04-18: qty 1

## 2023-04-18 MED ORDER — POTASSIUM CHLORIDE CRYS ER 20 MEQ PO TBCR
40.0000 meq | EXTENDED_RELEASE_TABLET | Freq: Once | ORAL | Status: AC
Start: 2023-04-18 — End: 2023-04-18
  Administered 2023-04-18: 40 meq via ORAL
  Filled 2023-04-18: qty 2

## 2023-04-18 MED ORDER — LORAZEPAM 2 MG/ML IJ SOLN
1.0000 mg | Freq: Once | INTRAMUSCULAR | Status: AC
  Administered 2023-04-18: 1 mg via INTRAVENOUS
  Filled 2023-04-18: qty 1

## 2023-04-18 MED ORDER — HALOPERIDOL LACTATE 5 MG/ML IJ SOLN
2.0000 mg | Freq: Four times a day (QID) | INTRAMUSCULAR | Status: DC | PRN
  Administered 2023-04-18 (×2): 2 mg via INTRAVENOUS
  Filled 2023-04-18 (×2): qty 1

## 2023-04-18 MED ORDER — LORAZEPAM 2 MG/ML IJ SOLN
1.0000 mg | INTRAMUSCULAR | Status: DC | PRN
  Administered 2023-04-18 – 2023-04-19 (×2): 1 mg via INTRAVENOUS
  Filled 2023-04-18 (×2): qty 1

## 2023-04-18 MED ORDER — LISINOPRIL 20 MG PO TABS
20.0000 mg | ORAL_TABLET | Freq: Every day | ORAL | Status: DC
  Administered 2023-04-18 – 2023-04-20 (×3): 20 mg via ORAL
  Filled 2023-04-18: qty 2
  Filled 2023-04-18: qty 1
  Filled 2023-04-18: qty 2

## 2023-04-18 MED ORDER — FEBUXOSTAT 40 MG PO TABS
40.0000 mg | ORAL_TABLET | Freq: Every day | ORAL | Status: DC
  Administered 2023-04-18 – 2023-04-22 (×5): 40 mg via ORAL
  Filled 2023-04-18 (×5): qty 1

## 2023-04-18 MED ORDER — COLCHICINE 0.6 MG PO TABS
0.6000 mg | ORAL_TABLET | Freq: Every day | ORAL | Status: DC
  Administered 2023-04-18 – 2023-04-20 (×3): 0.6 mg via ORAL
  Filled 2023-04-18 (×3): qty 1

## 2023-04-18 MED ORDER — DOCUSATE SODIUM 100 MG PO CAPS
100.0000 mg | ORAL_CAPSULE | Freq: Two times a day (BID) | ORAL | Status: DC
  Administered 2023-04-18 – 2023-04-22 (×5): 100 mg via ORAL
  Filled 2023-04-18 (×6): qty 1

## 2023-04-18 MED ORDER — SENNOSIDES-DOCUSATE SODIUM 8.6-50 MG PO TABS: 2.0000 | ORAL_TABLET | Freq: Every day | ORAL | 2 refills | Status: DC

## 2023-04-18 NOTE — ED Notes (Signed)
 Patient attempting to get out of the bed and fighting with staff and wife. Messaged MD about ativan.

## 2023-04-18 NOTE — ED Notes (Signed)
 Patient still attempting to get out of bed with staff and wife cues. MD notified.

## 2023-04-18 NOTE — ED Notes (Signed)
 Pt continues to sit on bedside commode, has had several small BM. Commode emptied and cleaned.

## 2023-04-18 NOTE — ED Notes (Signed)
 Pt wife stating pt has had a little blood come from penis after foley removal, wife informed this is normal due to some trauma that may have happened with insertion. Pt has yet to void. Pt moving from bed to commode with wife's help.

## 2023-04-18 NOTE — Progress Notes (Signed)
 PT Cancellation Note  Patient Details Name: Adrian Aguilar MRN: [id number] DOB: [birth year]   Cancelled Treatment:    Reason Eval/Treat Not Completed: Fatigue/lethargy limiting ability to participate;Patient's level of consciousness (Author arrives to ED8 for PT evlauation, wife at bedside, pt is asleep.) Per wife and ED staff, pt has been very anxious, impulsive regarding frequent bowel urgency today, has has several BM since ealier enema. Wife/staff both endorse a significant degree of unsteadiness on feet. Baseline collected from wife who reports no difficulty with basic mobility or balance normally. Pt was in a state of agitation earlier, difficult to calm or redirect, has since received anxiolytics and is now sleeping. Pt does not alert during 10 minute visit. We decide not to wake patient given his recent agitation and now heavily sedated state, as it would likely make his unsteadiness and safety issues worse. Will attempt evaluation again next date if possible, wife reports mornings are better for cognition.   4:30 PM, 04/18/23 [name], PT, DPT Physical Therapist - [facility]  [phone] (ASCOM)    [name] 04/18/2023, 4:27 PM

## 2023-04-18 NOTE — ED Notes (Signed)
Pt given saltines and apple juice

## 2023-04-18 NOTE — ED Notes (Signed)
 Pt cleaned up after a bowel movement and new brief applied

## 2023-04-18 NOTE — ED Notes (Signed)
 Pt wife coming out of room asking for help getting pt on bedside commode. States he was given an enema and is ready to have BM. Pt placed on bedside commode. Pt unsteady on feet. Pt and wife informed to call out when ready to get back in bed.

## 2023-04-18 NOTE — ED Notes (Signed)
 Re-ordered Ativan order. Patient combative with staff and wife. MD notified

## 2023-04-18 NOTE — Progress Notes (Signed)
 [facility] PROGRESS NOTE   Adrian Aguilar FMW:[id number] DOB: [birth year] DOA: 04/17/2023  PCP: Lemon Lamar Flavors, MD  Brief History: 89 year old male with history of CKD 3B, PAD status post aortic bifurcation stenting, hypertension, who presents emergency department for chief concerns of altered mental status and increasing fall.  Infectious workup was unremarkable.  Patient was empirically placed on antibacterials.  He was also complaining of abdominal pain which prompted a CT scan which showed severe constipation.  Consultants: None  Procedures: None    Subjective/Interval History: Patient pleasantly confused.  His wife is at the bedside.  He has had several bowel movements since last night.  Does not appear to be in any discomfort or distress.  He denies any pain currently.  Wife is very much interested in taking him home this afternoon.    Assessment/Plan:  Acute metabolic encephalopathy in the setting of known dementia Likely brought on by severe constipation.  No other etiology has been found.  CT scan of the head did not show any acute findings.  He has had several bowel movements overnight and has experienced an improvement in his mentation per his wife.  Could be back to his baseline now.  No obvious focal neurological deficits.  Leukocytosis Likely reactive.  Empirically started on antibiotics.  However procalcitonin less than 0.1.  Will discontinue antibiotics.  COVID-19 influenza and RSV PCR's were negative.  Severe constipation CT scan shows severe constipation.  Manual disimpaction was unsuccessful.  Was given a bottle of mag citrate overnight with which she has had multiple BMs.  Abdomen is benign on examination.  Can be further addressed in the outpatient setting.  Will be discharged on laxatives and stool softeners if he does go home later today.  Will check a TSH tomorrow morning if he is still here.  History of peripheral artery disease/abdominal aortic  aneurysm. Stable.  Chronic kidney disease stage IIIb Stable.  Essential hypertension Stable.  Supplement potassium.  Foley catheter This was placed when he was brought in with altered mental status.  Voiding trial to be done prior to discharge.   DVT Prophylaxis: Subcutaneous heparin  Code Status: Full code Family Communication: Discussed with patient Disposition Plan: Hopefully return home this afternoon as per his wife's wishes      Medications: Scheduled:  docusate sodium   100 mg Oral BID   heparin   5,000 Units Subcutaneous Q8H   melatonin  5 mg Oral QHS   Continuous: PRN:acetaminophen  **OR** acetaminophen , bisacodyl , haloperidol  lactate, hydrALAZINE , LORazepam , ondansetron  **OR** ondansetron  (ZOFRAN ) IV  Antibiotics: Anti-infectives (From admission, onward)    Start     Dose/Rate Route Frequency Ordered Stop   04/18/23 2000  vancomycin  (VANCOCIN ) IVPB 1000 mg/200 mL premix  Status:  Discontinued       Placed in Followed by Linked Group   1,000 mg 200 mL/hr over 60 Minutes Intravenous Every 24 hours 04/17/23 1903 04/18/23 1049   04/18/23 0400  ceFEPIme  (MAXIPIME ) 2 g in sodium chloride  0.9 % 100 mL IVPB  Status:  Discontinued        2 g 200 mL/hr over 30 Minutes Intravenous Every 12 hours 04/17/23 1903 04/18/23 1049   04/17/23 2000  vancomycin  (VANCOCIN ) 750 mg in sodium chloride  0.9 % 250 mL IVPB       Placed in Followed by Linked Group   750 mg 250 mL/hr over 60 Minutes Intravenous  Once 04/17/23 1903 04/17/23 2102   04/17/23 1845  metroNIDAZOLE  (FLAGYL ) IVPB 500 mg  Status:  Discontinued        500 mg 100 mL/hr over 60 Minutes Intravenous Every 12 hours 04/17/23 1842 04/18/23 1049   04/17/23 1530  vancomycin  (VANCOCIN ) IVPB 1000 mg/200 mL premix        1,000 mg 200 mL/hr over 60 Minutes Intravenous  Once 04/17/23 1529 04/17/23 1804   04/17/23 1530  ceFEPIme  (MAXIPIME ) 2 g in sodium chloride  0.9 % 100 mL IVPB        2 g 200 mL/hr over 30 Minutes  Intravenous  Once 04/17/23 1529 04/17/23 1640       Objective:  Vital Signs  Vitals:   04/18/23 0500 04/18/23 0530 04/18/23 0630 04/18/23 0730  BP: (!) 138/58 (!) 153/79 (!) 111/45 (!) 139/119  Pulse: 61 72 (!) 58 (!) 162  Resp: 16 20 19  (!) 21  Temp:    98 F (36.7 C)  TempSrc:    Oral  SpO2: 100% 97% 99% 100%  Weight:        Intake/Output Summary (Last 24 hours) at 04/18/2023 1119 Last data filed at 04/18/2023 0400 Gross per 24 hour  Intake --  Output 3990 ml  Net -3990 ml   Filed Weights   04/17/23 1437  Weight: 83 kg    General appearance: Awake alert.  In no distress.  Pleasantly confused Resp: Clear to auscultation bilaterally.  Normal effort Cardio: S1-S2 is normal regular.  No S3-S4.  No rubs murmurs or bruit GI: Abdomen is soft.  Nontender nondistended.  Bowel sounds are present normal.  No masses organomegaly Extremities: No edema.  Full range of motion of lower extremities. Neurologic:   No focal neurological deficits.    Lab Results:  Data Reviewed: I have personally reviewed following labs and reports of the imaging studies  CBC: Recent Labs  Lab 04/17/23 1431 04/18/23 0312  WBC 16.6* 9.1  NEUTROABS 13.8*  --   HGB 14.6 13.4  HCT 41.4 38.9*  MCV 85.2 84.7  PLT 266 242    Basic Metabolic Panel: Recent Labs  Lab 04/17/23 1431 04/18/23 0312  NA 131* 137  K 3.7 3.5  CL 99 106  CO2 22 24  GLUCOSE 110* 106*  BUN 29* 26*  CREATININE 1.40* 1.41*  CALCIUM 9.7 9.4    GFR: CrCl cannot be calculated (Unknown ideal weight.).  Liver Function Tests: Recent Labs  Lab 04/17/23 1431  AST 27  ALT 21  ALKPHOS 85  BILITOT 0.9  PROT 8.2*  ALBUMIN 4.8    Coagulation Profile: Recent Labs  Lab 04/17/23 1431  INR 1.0     Recent Results (from the past 240 hours)  Culture, blood (Routine x 2)     Status: None (Preliminary result)   Collection Time: 04/17/23  2:36 PM   Specimen: BLOOD  Result Value Ref Range Status   Specimen  Description BLOOD RIGHT ANTECUBITAL  Final   Special Requests   Final    BOTTLES DRAWN AEROBIC AND ANAEROBIC Blood Culture adequate volume   Culture   Final    NO ORGANISMS SEEN Performed at [facility], [address] [place], [place] [address]    Report Status PENDING  Incomplete  Culture, blood (Routine x 2)     Status: None (Preliminary result)   Collection Time: 04/17/23  2:40 PM   Specimen: BLOOD  Result Value Ref Range Status   Specimen Description BLOOD LAC  Final   Special Requests   Final    BOTTLES DRAWN AEROBIC AND ANAEROBIC Blood Culture  adequate volume   Culture   Final    NO GROWTH < 24 HOURS Performed at [facility], [address] [place], [place] [address]    Report Status PENDING  Incomplete  Resp panel by RT-PCR (RSV, Flu A&B, Covid) Anterior Nasal Swab     Status: None   Collection Time: 04/17/23  6:08 PM   Specimen: Anterior Nasal Swab  Result Value Ref Range Status   SARS Coronavirus 2 by RT PCR NEGATIVE NEGATIVE Final    Comment: (NOTE) SARS-CoV-2 target nucleic acids are NOT DETECTED.  The SARS-CoV-2 RNA is generally detectable in upper respiratory specimens during the acute phase of infection. The lowest concentration of SARS-CoV-2 viral copies this assay can detect is 138 copies/mL. A negative result does not preclude SARS-Cov-2 infection and should not be used as the sole basis for treatment or other patient management decisions. A negative result may occur with  improper specimen collection/handling, submission of specimen other than nasopharyngeal swab, presence of viral mutation(s) within the areas targeted by this assay, and inadequate number of viral copies(<138 copies/mL). A negative result must be combined with clinical observations, patient history, and epidemiological information. The expected result is Negative.  Fact Sheet for Patients:  bloggercourse.com  Fact Sheet for Healthcare  Providers:  seriousbroker.it  This test is no t yet approved or cleared by the [place]  FDA and  has been authorized for detection and/or diagnosis of SARS-CoV-2 by FDA under an Emergency Use Authorization (EUA). This EUA will remain  in effect (meaning this test can be used) for the duration of the COVID-19 declaration under Section 564(b)(1) of the Act, 21 U.S.C.section 360bbb-3(b)(1), unless the authorization is terminated  or revoked sooner.       Influenza A by PCR NEGATIVE NEGATIVE Final   Influenza B by PCR NEGATIVE NEGATIVE Final    Comment: (NOTE) The Xpert Xpress SARS-CoV-2/FLU/RSV plus assay is intended as an aid in the diagnosis of influenza from Nasopharyngeal swab specimens and should not be used as a sole basis for treatment. Nasal washings and aspirates are unacceptable for Xpert Xpress SARS-CoV-2/FLU/RSV testing.  Fact Sheet for Patients: bloggercourse.com  Fact Sheet for Healthcare Providers: seriousbroker.it  This test is not yet approved or cleared by the [place]  FDA and has been authorized for detection and/or diagnosis of SARS-CoV-2 by FDA under an Emergency Use Authorization (EUA). This EUA will remain in effect (meaning this test can be used) for the duration of the COVID-19 declaration under Section 564(b)(1) of the Act, 21 U.S.C. section 360bbb-3(b)(1), unless the authorization is terminated or revoked.     Resp Syncytial Virus by PCR NEGATIVE NEGATIVE Final    Comment: (NOTE) Fact Sheet for Patients: bloggercourse.com  Fact Sheet for Healthcare Providers: seriousbroker.it  This test is not yet approved or cleared by the [place]  FDA and has been authorized for detection and/or diagnosis of SARS-CoV-2 by FDA under an Emergency Use Authorization (EUA). This EUA will remain in effect (meaning this test can be  used) for the duration of the COVID-19 declaration under Section 564(b)(1) of the Act, 21 U.S.C. section 360bbb-3(b)(1), unless the authorization is terminated or revoked.  Performed at [facility], [address] [place], [place] [address]       Radiology Studies: CT ABDOMEN PELVIS W CONTRAST Result Date: 04/17/2023 CLINICAL DATA:  Diarrhea.  Abdominal pain. EXAM: CT ABDOMEN AND PELVIS WITH CONTRAST TECHNIQUE: Multidetector CT imaging of the abdomen and pelvis was performed using the standard protocol  following bolus administration of intravenous contrast. RADIATION DOSE REDUCTION: This exam was performed according to the departmental dose-optimization program which includes automated exposure control, adjustment of the mA and/or kV according to patient size and/or use of iterative reconstruction technique. CONTRAST:  80mL OMNIPAQUE  IOHEXOL  300 MG/ML  SOLN COMPARISON:  None Available. FINDINGS: Lower chest: Mild dependent atelectasis is present both lungs. Lungs otherwise clear. The heart size is normal. Artery calcifications are present. No significant pleural or pericardial effusion is present. Hepatobiliary: No focal liver abnormality is seen. No gallstones, gallbladder wall thickening, or biliary dilatation. Pancreas: Unremarkable. No pancreatic ductal dilatation or surrounding inflammatory changes. Spleen: Normal in size without focal abnormality. Adrenals/Urinary Tract: The adrenal glands are normal bilaterally. A simple cyst at the upper pole of the right kidney measures 3.2 cm. A posterior simple cyst measures 10 mm. Smaller cysts are present at the lower pole of the left kidney. No follow-up imaging is recommended no stone or solid mass lesion is present. No obstruction is present. The ureters are within normal limits. A Foley catheter is present within the urinary bladder. Stomach/Bowel: The stomach and duodenum are within normal limits. Small bowel is unremarkable. Terminal ileum  is within normal limits. The appendix is visualized and normal. The ascending and transverse colon are normal. Diverticular changes are present in the distal descending and at the sigmoid colon. No focal inflammatory changes are present to suggest diverticulitis. Moderate stool distends the rectum to 7.2 cm and transverse diameter. There is some wall thickening and gas around the stool ball without significant inflammatory change. No proximal obstruction is present. Vascular/Lymphatic: An aortic bi-femoral stent graft is in place. Atherosclerotic calcifications are present in the more proximal aorta and branch vessels. Calcifications are present in the external iliac arteries and femoral arteries. 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 Reproductive: Prostate is unremarkable. Other: No abdominal wall hernia or abnormality. No abdominopelvic ascites. Musculoskeletal: Grade 1 degenerative anterolisthesis is present at L4-5. Moderate facet hypertrophy is present bilaterally at L4-5 and L5-S1. No focal osseous lesions are present. The bony pelvis is normal. The hips are located and normal bilaterally. IMPRESSION: 1. Moderate stool distends the rectum to 7.2 cm and transverse diameter. There is some wall thickening and gas around the stool ball without significant inflammatory change. Stool impaction may be the source of the patient's pain. 2. Descending and sigmoid diverticulosis without diverticulitis. 3. Aortic bi-femoral stent graft in place. 4. Grade 1 degenerative anterolisthesis at L4-5. 5. Moderate facet hypertrophy bilaterally at L4-5 and L5-S1. 6.  Aortic Atherosclerosis (ICD10-I70.0). Electronically Signed   By: Lonni Necessary M.D.   On: 04/17/2023 17:37   CT Head Wo Contrast Result Date: 04/17/2023 CLINICAL DATA:  Altered mental status.  Fall EXAM: CT HEAD WITHOUT CONTRAST CT CERVICAL SPINE WITHOUT CONTRAST TECHNIQUE: Multidetector CT imaging of the head and cervical spine was performed following the standard  protocol without intravenous contrast. Multiplanar CT image reconstructions of the cervical spine were also generated. RADIATION DOSE REDUCTION: This exam was performed according to the departmental dose-optimization program which includes automated exposure control, adjustment of the mA and/or kV according to patient size and/or use of iterative reconstruction technique. COMPARISON:  None Available. FINDINGS: CT HEAD FINDINGS Brain: No acute intracranial hemorrhage. No focal mass lesion. No CT evidence of acute infarction. No midline shift or mass effect. No hydrocephalus. Basilar cisterns are patent. There are periventricular and subcortical white matter hypodensities. Generalized cortical atrophy. Vascular: No hyperdense vessel or unexpected calcification. Skull: Normal. Negative for fracture or focal  lesion. Sinuses/Orbits: Paranasal sinuses and mastoid air cells are clear. Orbits are clear. Other: None. CT CERVICAL SPINE FINDINGS Alignment: Normal alignment of the cervical vertebral bodies. Skull base and vertebrae: Normal craniocervical junction. No loss of vertebral body height or disc height. Normal facet articulation. No evidence of fracture. Soft tissues and spinal canal: No prevertebral soft tissue swelling. No perispinal or epidural hematoma. Disc levels: Anterior osteophytes from C5 to C7 with mild disc space narrowing. No acute findings. Upper chest: Clear Other: None IMPRESSION: 1. No acute intracranial findings. 2. Atrophy and white matter microvascular disease. 3. No cervical spine fracture. 4. Mild multilevel disc osteophytic disease. Electronically Signed   By: Jackquline Boxer M.D.   On: 04/17/2023 16:23   CT Cervical Spine Wo Contrast Result Date: 04/17/2023 CLINICAL DATA:  Altered mental status.  Fall EXAM: CT HEAD WITHOUT CONTRAST CT CERVICAL SPINE WITHOUT CONTRAST TECHNIQUE: Multidetector CT imaging of the head and cervical spine was performed following the standard protocol without  intravenous contrast. Multiplanar CT image reconstructions of the cervical spine were also generated. RADIATION DOSE REDUCTION: This exam was performed according to the departmental dose-optimization program which includes automated exposure control, adjustment of the mA and/or kV according to patient size and/or use of iterative reconstruction technique. COMPARISON:  None Available. FINDINGS: CT HEAD FINDINGS Brain: No acute intracranial hemorrhage. No focal mass lesion. No CT evidence of acute infarction. No midline shift or mass effect. No hydrocephalus. Basilar cisterns are patent. There are periventricular and subcortical white matter hypodensities. Generalized cortical atrophy. Vascular: No hyperdense vessel or unexpected calcification. Skull: Normal. Negative for fracture or focal lesion. Sinuses/Orbits: Paranasal sinuses and mastoid air cells are clear. Orbits are clear. Other: None. CT CERVICAL SPINE FINDINGS Alignment: Normal alignment of the cervical vertebral bodies. Skull base and vertebrae: Normal craniocervical junction. No loss of vertebral body height or disc height. Normal facet articulation. No evidence of fracture. Soft tissues and spinal canal: No prevertebral soft tissue swelling. No perispinal or epidural hematoma. Disc levels: Anterior osteophytes from C5 to C7 with mild disc space narrowing. No acute findings. Upper chest: Clear Other: None IMPRESSION: 1. No acute intracranial findings. 2. Atrophy and white matter microvascular disease. 3. No cervical spine fracture. 4. Mild multilevel disc osteophytic disease. Electronically Signed   By: Jackquline Boxer M.D.   On: 04/17/2023 16:23   DG Chest Port 1 View Result Date: 04/17/2023 CLINICAL DATA:  Sepsis, altered mental status EXAM: PORTABLE CHEST 1 VIEW COMPARISON:  None Available. FINDINGS: Atherosclerotic calcification of the aortic arch. The overlap of the left posterior seventh rib and inferior scapular tip, an 8 mm density is present  and a true intrapulmonary nodule is not excluded. Chest CT is recommended for further characterization. The lungs appear otherwise clear. Cardiac and mediastinal margins appear normal. IMPRESSION: 1. 8 mm density in the left lung base, possibly a true intrapulmonary nodule. Chest CT is recommended for further characterization. 2.  Aortic Atherosclerosis (ICD10-I70.0). Electronically Signed   By: Ryan Salvage M.D.   On: 04/17/2023 15:47       LOS: 1 day   Adrian Aguilar  [facility] Pager on [email or web address]  04/18/2023, 11:19 AM

## 2023-04-18 NOTE — ED Notes (Addendum)
 Pt cleaned up, brief changed, bed pads changed, and linens changed. Pt resting in bed

## 2023-04-18 NOTE — ED Notes (Signed)
 Pt attempting to get out of bed and pull at PIV's in arm

## 2023-04-18 NOTE — Evaluation (Addendum)
 Clinical/Bedside Swallow Evaluation Patient Details  Name: Adrian Aguilar MRN: [id number] Date of Birth: Jun 28, 1932  Today's Date: 04/18/2023 Time: SLP Start Time (ACUTE ONLY): 1155 SLP Stop Time (ACUTE ONLY): 1240 SLP Time Calculation (min) (ACUTE ONLY): 45 min  Past Medical History:  Past Medical History:  Diagnosis Date   AAA (abdominal aortic aneurysm) (HCC)    Hypertension    PAD (peripheral artery disease) (HCC)    PAD with stent placed to artery in R leg   Renal disorder    stage 3 CKD   Past Surgical History:  Past Surgical History:  Procedure Laterality Date   CATARACT EXTRACTION W/ INTRAOCULAR LENS IMPLANT Bilateral    HPI:  Pt is a 89 year old male with history of CKD 3B, PAD status post aortic bifurcation stenting, hypertension, who presents emergency department for chief concerns of altered mental status and increasing fall.   Admitting dx including: Acute metabolic encephalopathy in the setting of known Dementia; severe constipation.    Imaging: Head: No acute intracranial findings.  2. Atrophy and white matter microvascular disease.     CXR: 8 mm density in the left lung base, possibly a true  intrapulmonary nodule. Chest CT is recommended for further  characterization.  2.  Aortic Atherosclerosis    Assessment / Plan / Recommendation  Clinical Impression   Pt seen for BSE today. Pt awake, verbal and pleasant. Oriented to self and Wife. Impulsive to get up but rightly so(?) in setting of diarrhea episodes. Baseline Dementia and need for 100% Supervision baseline. Wife denied any swallowing issues at home; feels that finger foods may be easier for pt to self-feed w/. Mitts at times.  Pt on RA, afebrile. WBC WNL.   Pt appears to present w/ grossly functional oropharyngeal phase swallow w/ No oropharyngeal phase dysphagia noted, No neuromuscular deficits noted in setting of Baseline Dementia. Pt consumed po trials w/ No overt, clinical s/s of aspiration during po trials.   Pt appears at reduced risk for aspiration following general aspiration precautions and monitoring/Supervision at meals d/t Cognitive decline, w/ slightly modified foods -- cut/moistened for ease of eating as well as finger foods for ease of self-feeding. Pt does have challenging factors that could impact his oropharyngeal swallowing to include deconditioning/weakness, need for support at meals w/ feeding, and Baseline Cognitive decline/Dementia. ANY Cognitive decline can impact timing and awareness during oral intake thus increase risk for dysphagia as well as decreased oral intake overall.   During po trials, pt consumed all consistencies w/ no overt coughing, decline in vocal quality, or change in respiratory presentation during/post trials. O2 sats upper 90s. Oral phase appeared grossly [facility] w/ timely bolus management, mastication, and control of bolus propulsion for A-P transfer for swallowing. Oral clearing achieved w/ all trial consistencies -- moistened, soft foods given. OM Exam appeared [facility] w/ no unilateral weakness noted. Speech Clear. Pt fed self w/ MOD+ setup support.   Recommend continue a more mech soft/regular consistency diet w/ well-Cut meats, moistened foods; Thin liquids -- carefully monitor straw use, and pt should help to Hold Cup when drinking. Recommend general aspiration precautions, reduce distractions at meals. 100% Supervision w/ meals for support. Tray setup and sitting up for all oral intake. Pills WHOLE in Puree for safer, easier swallowing -- this was discussed w/ Wife in setting of pt's Cognitive decline and recommended now and for D/C home.  Education given on Pills in Puree; food consistencies and easy to eat options; general aspiration precautions to pt  and Wife.  Pt appears at his Baseline re: swallowing. MD/NSG to reconsult ST services if any new needs arise. NSG updated, agreed. MD updated. Recommend Dietician f/u for support. SLP Visit Diagnosis: Dysphagia, unspecified  (R13.10) (baseline Dementia)    Aspiration Risk   (reduced following general aspiration; support at meals)    Diet Recommendation   Thin;Dysphagia 3 (mechanical soft) (more cut foods; moistened; finger foods may be helpful) = a more mech soft/regular consistency diet w/ well-Cut meats, moistened foods; Thin liquids -- carefully monitor straw use, and pt should help to Hold Cup when drinking. Recommend general aspiration precautions, reduce distractions at meals. Tray setup and sitting up for all oral intake.   Medication Administration: Whole meds with puree (as needed for ease of swallowing)    Other  Recommendations Recommended Consults:  (Dietician f/u) Oral Care Recommendations: Oral care BID;Oral care before and after PO;Staff/trained caregiver to provide oral care    Recommendations for follow up therapy are one component of a multi-disciplinary discharge planning process, led by the attending physician.  Recommendations may be updated based on patient status, additional functional criteria and insurance authorization.  Follow up Recommendations No SLP follow up      Assistance Recommended at Discharge  FULL at meals d/t Dementia  Functional Status Assessment Patient has not had a recent decline in their functional status  Frequency and Duration  (n/a)   (n/a)       Prognosis Prognosis for improved oropharyngeal function: Good Barriers to Reach Goals: Cognitive deficits;Time post onset;Severity of deficits;Behavior (impulsivity) Barriers/Prognosis Comment: baseline Dementia      Swallow Study   General Date of Onset: 04/17/23 HPI: Pt is a 89 year old male with history of CKD 3B, PAD status post aortic bifurcation stenting, hypertension, who presents emergency department for chief concerns of altered mental status and increasing fall.   Admitting dx including: Acute metabolic encephalopathy in the setting of known Dementia; severe constipation.    Imaging: Head: No acute  intracranial findings.  2. Atrophy and white matter microvascular disease.     CXR: 8 mm density in the left lung base, possibly a true  intrapulmonary nodule. Chest CT is recommended for further  characterization.  2.  Aortic Atherosclerosis Type of Study: Bedside Swallow Evaluation Previous Swallow Assessment: none Diet Prior to this Study: Thin liquids (Level 0);Dysphagia 3 (mechanical soft) Temperature Spikes Noted: No (wbc 9.1) Respiratory Status: Room air History of Recent Intubation: No Behavior/Cognition: Alert;Cooperative;Pleasant mood;Confused;Distractible;Requires cueing (Wife present) Oral Cavity Assessment: Within Functional Limits Oral Care Completed by SLP: Recent completion by staff Vision: Functional for self-feeding Self-Feeding Abilities: Able to feed self;Needs set up;Needs assist (100% supervision) Patient Positioning: Upright in chair Baseline Vocal Quality: Normal Volitional Cough: Strong Volitional Swallow: Unable to elicit (did not follow command)    Oral/Motor/Sensory Function Overall Oral Motor/Sensory Function: Within functional limits (no unilateral weakness)   Ice Chips Ice chips: Not tested   Thin Liquid Thin Liquid: Within functional limits Presentation: Self Fed;Cup;Straw (~4-5 ozs total) Other Comments: water , juice    Nectar Thick Nectar Thick Liquid: Not tested   Honey Thick Honey Thick Liquid: Not tested   Puree Puree: Within functional limits Presentation: Self Fed;Spoon (supported; ~9 trials)   Solid     Solid: Within functional limits Presentation:  (supported; 6 trials) Other Comments: moistened        Comer Portugal, MS, CCC-SLP Speech Language Pathologist Rehab Services; [facility] [place] [phone] (ascom) Tyriana Helmkamp 04/18/2023,1:04 PM

## 2023-04-18 NOTE — ED Notes (Signed)
 Pt up to bedside commode to try and have a bowel movement. Pt was unsuccessful. PT back in bed, call bell within reach. No other needs at this time.

## 2023-04-19 DIAGNOSIS — R4182 Altered mental status, unspecified: Secondary | ICD-10-CM | POA: Diagnosis not present

## 2023-04-19 LAB — BASIC METABOLIC PANEL
Anion gap: 12 (ref 5–15)
BUN: 24 mg/dL — ABNORMAL HIGH (ref 8–23)
CO2: 20 mmol/L — ABNORMAL LOW (ref 22–32)
Calcium: 9.3 mg/dL (ref 8.9–10.3)
Chloride: 106 mmol/L (ref 98–111)
Creatinine, Ser: 1.44 mg/dL — ABNORMAL HIGH (ref 0.61–1.24)
GFR, Estimated: 47 mL/min — ABNORMAL LOW (ref >60)
Glucose, Bld: 121 mg/dL — ABNORMAL HIGH (ref 70–99)
Potassium: 4.3 mmol/L (ref 3.5–5.1)
Sodium: 138 mmol/L (ref 135–145)

## 2023-04-19 LAB — CBC
HCT: 39.4 % (ref 39.0–52.0)
Hemoglobin: 13.9 g/dL (ref 13.0–17.0)
MCH: 29.5 pg (ref 26.0–34.0)
MCHC: 35.3 g/dL (ref 30.0–36.0)
MCV: 83.7 fL (ref 80.0–100.0)
Platelets: 254 10*3/uL (ref 150–400)
RBC: 4.71 MIL/uL (ref 4.22–5.81)
RDW: 12.5 % (ref 11.5–15.5)
WBC: 9.5 10*3/uL (ref 4.0–10.5)
nRBC: 0 % (ref 0.0–0.2)

## 2023-04-19 LAB — TSH: TSH: 4.133 u[IU]/mL (ref 0.350–4.500)

## 2023-04-19 LAB — MAGNESIUM: Magnesium: 2.4 mg/dL (ref 1.7–2.4)

## 2023-04-19 MED ORDER — HALOPERIDOL LACTATE 5 MG/ML IJ SOLN
1.0000 mg | Freq: Once | INTRAMUSCULAR | Status: AC
  Administered 2023-04-19: 1 mg via INTRAVENOUS
  Filled 2023-04-19: qty 1

## 2023-04-19 NOTE — ED Notes (Signed)
 Geradine Girt, NP at bedside

## 2023-04-19 NOTE — ED Notes (Signed)
 Pt resting quietly with eyes closed. Wife reports improvement. Denies any needs at this time. Encouraged to alert staff to any needs.

## 2023-04-19 NOTE — ED Notes (Signed)
 Pt with wet and soiled diaper. Soft, brown BM. Perineal care performed. Purewick placed and new incontinence brief placed. New absorbent pads placed underneath patient. Pt put into new gown. Pt repositioned with pillows. Pt more helpful with changing (raised his hips and helped with rolling).

## 2023-04-19 NOTE — ED Notes (Signed)
 PT at bedside.

## 2023-04-19 NOTE — ED Notes (Signed)
 Urine container appears to have approx yellow urine

## 2023-04-19 NOTE — ED Notes (Signed)
 Pt restless in bed. Wife at bedside redirecting pt.

## 2023-04-19 NOTE — Progress Notes (Signed)
 OT Cancellation Note  Patient Details Name: Adrian Aguilar MRN: [id number] DOB: July 07, 1932   Cancelled Treatment:    Reason Eval/Treat Not Completed: OT screened, no needs identified, will sign off. Per family/spouse report during PT evaluation, they are declining a need for OT services d/t his baseline level of assist at home. OT to sign off in house and can be consulted again if needs arise.  [name] E Bryceson Grape 04/19/2023, 9:18 AM

## 2023-04-19 NOTE — ED Notes (Signed)
 Blood pressure rechecked. Pt not able to hold still. Wife declines hydralazine at this time offers that she thinks when he is holding still his bp will be better. Offers that she is more comfortable waiting for patient's home medication this morning.

## 2023-04-19 NOTE — Progress Notes (Signed)
 Progress Note   Patient: Adrian Aguilar FMW:[id number] DOB: [birth year] DOA: 04/17/2023     2 DOS: the patient was seen and examined on 04/19/2023   Brief hospital course:  89 year old male with history of CKD 3B, PAD status post aortic bifurcation stenting, hypertension, who presents emergency department for chief concerns of altered mental status and increasing fall.  Infectious workup was unremarkable.  Patient was empirically placed on antibacterials.  He was also complaining of abdominal pain which prompted a CT scan which showed severe constipation.    Assessment/Plan:   Acute metabolic encephalopathy in the setting of known dementia Likely brought on by severe constipation.  No other etiology has been found.  CT scan of the head did not show any acute findings.  He has had several bowel movements overnight and has experienced an improvement in his mentation per his wife.  Could be back to his baseline now.  No obvious focal neurological deficits. Continue to monitor neurochecks Currently on as needed antipsychotics TOC working on placement PT OT recommending skilled nursing facility placement  Leukocytosis-resolved   Severe constipation Continue laxatives   History of peripheral artery disease/abdominal aortic aneurysm. Stable.   Chronic kidney disease stage IIIb Continue to monitor renal function   Essential hypertension Stable.  Supplement potassium.   Foley catheter This was placed when he was brought in with altered mental status.  Voiding trial to be done prior to discharge.     DVT Prophylaxis: Subcutaneous heparin  Code Status: Full code Family Communication: Discussed with patient Disposition Plan: Hopefully return home this afternoon as per his wife's wishes   Subjective:  Patient seen and examined in the presence of the wife Had episode of agitation this morning attempting to get out of bed Unable to contribute to history given underlying  dementia  Physical Exam: General appearance: Awake alert.  In no distress.  Pleasantly confused Resp: Clear to auscultation bilaterally.  Normal effort Cardio: S1-S2 is normal regular.  No S3-S4.  No rubs murmurs or bruit GI: Abdomen is soft.  Nontender nondistended.  Bowel sounds are present normal.  No masses organomegaly Extremities: No edema.  Full range of motion of lower extremities. Neurologic:   No focal neurological deficits.     Vitals:   04/19/23 1355 04/19/23 1412 04/19/23 1430 04/19/23 1600  BP: (!) 220/88 106/77 (!) 127/116 (!) 116/91  Pulse:  95 (!) 109 (!) 120  Resp:  18    Temp:      TempSrc:      SpO2:  97% 96% 93%  Weight:        Data Reviewed:    Latest Ref Rng & Units 04/19/2023    4:00 AM 04/18/2023    3:12 AM 04/17/2023    2:31 PM  BMP  Glucose 70 - 99 mg/dL 878  893  889   BUN 8 - 23 mg/dL 24  26  29    Creatinine 0.61 - 1.24 mg/dL 8.55  8.58  8.59   Sodium 135 - 145 mmol/L 138  137  131   Potassium 3.5 - 5.1 mmol/L 4.3  3.5  3.7   Chloride 98 - 111 mmol/L 106  106  99   CO2 22 - 32 mmol/L 20  24  22    Calcium 8.9 - 10.3 mg/dL 9.3  9.4  9.7        Latest Ref Rng & Units 04/19/2023    4:00 AM 04/18/2023    3:12 AM 04/17/2023  2:31 PM  CBC  WBC 4.0 - 10.5 K/uL 9.5  9.1  16.6   Hemoglobin 13.0 - 17.0 g/dL 86.0  86.5  85.3   Hematocrit 39.0 - 52.0 % 39.4  38.9  41.4   Platelets 150 - 400 K/uL 254  242  266    Time spent: 42 minutes  Author: Drue ONEIDA Potter, MD 04/19/2023 5:11 PM  For on call review [email or web address].

## 2023-04-19 NOTE — ED Notes (Signed)
Mittens applied to pt 

## 2023-04-19 NOTE — ED Notes (Signed)
 Pt incontinence brief changed. Perineal care performed. New brief applied and new absorbent pads placed. Pt given new gown. Pt repositioned with pillows. Pt agitated and restless, wanting to get up out of bed.

## 2023-04-19 NOTE — NC FL2 (Signed)
 [place]  MEDICAID FL2 LEVEL OF CARE FORM     IDENTIFICATION  Patient Name: Adrian Aguilar Birthdate: 08/24/1932 Sex: male Admission Date (Current Location): 04/17/2023  [facility] and [place] Number:  Chiropodist and Address:  [facility], [address], [place], [place] [address]      Provider Number: [id number]  Attending Physician Name and Address:  Dorinda Drue DASEN, MD  Relative Name and Phone Number:       Current Level of Care: Hospital Recommended Level of Care: Skilled Nursing Facility Prior Approval Number:    Date Approved/Denied:   PASRR Number: [id number]  Discharge Plan: SNF    Current Diagnoses: Patient Active Problem List   Diagnosis Date Noted   AMS (altered mental status) 04/18/2023   Altered mental status 04/17/2023   Essential hypertension 04/17/2023   CKD stage 3b, GFR 30-44 ml/min (HCC) 04/17/2023   Dementia (HCC) 04/17/2023   Abdominal aortic aneurysm (AAA) (HCC) 04/17/2023   PVD (peripheral vascular disease) (HCC) 04/17/2023   Constipation 04/17/2023   Leukocytosis 04/17/2023    Orientation RESPIRATION BLADDER Height & Weight     Self  Normal Incontinent Weight: 183 lb (83 kg) Height:     BEHAVIORAL SYMPTOMS/MOOD NEUROLOGICAL BOWEL NUTRITION STATUS  Other (Comment) (Restless.)  (Dementia) Continent Diet (DYS 3. Extra gravy on meats, potatoes. May have baked/sweet potatoes. Finger Foods as able.)  AMBULATORY STATUS COMMUNICATION OF NEEDS Skin   Limited Assist Verbally Normal                       Personal Care Assistance Level of Assistance  Bathing, Feeding, Dressing Bathing Assistance: Limited assistance Feeding assistance: Limited assistance Dressing Assistance: Limited assistance     Functional Limitations Info  Sight, Hearing, Speech Sight Info: Adequate Hearing Info: Adequate Speech Info: Adequate    SPECIAL CARE FACTORS FREQUENCY  PT (By licensed PT)     PT Frequency: 5 x week               Contractures Contractures Info: Not present    Additional Factors Info  Code Status, Allergies Code Status Info: Full Allergies Info: Meat Extract, Rosuvastatin, Allopurinol, Propofol , Shellfish Allergy           Current Medications (04/19/2023):  This is the current hospital active medication list Current Facility-Administered Medications  Medication Dose Route Frequency Provider Last Rate Last Admin   acetaminophen  (TYLENOL ) tablet 650 mg  650 mg Oral Q6H PRN Cox, Amy N, DO       Or   acetaminophen  (TYLENOL ) suppository 650 mg  650 mg Rectal Q6H PRN Cox, Amy N, DO       bisacodyl  (DULCOLAX) suppository 10 mg  10 mg Rectal Daily PRN Jesus America, NP   10 mg at 04/18/23 9349   colchicine  tablet 0.6 mg  0.6 mg Oral Daily Krishnan, Gokul, MD   0.6 mg at 04/19/23 9092   docusate sodium  (COLACE) capsule 100 mg  100 mg Oral BID Jesus America, NP   100 mg at 04/19/23 9092   febuxostat  (ULORIC ) tablet 40 mg  40 mg Oral Daily Krishnan, Gokul, MD   40 mg at 04/19/23 9092   haloperidol  lactate (HALDOL ) injection 2 mg  2 mg Intravenous Q6H PRN Krishnan, Gokul, MD   2 mg at 04/18/23 1801   heparin  injection 5,000 Units  5,000 Units Subcutaneous Q8H Cox, Amy N, DO   5,000 Units at 04/19/23 1359   hydrALAZINE  (APRESOLINE ) injection 5 mg  5 mg Intravenous Q6H PRN Cox, Amy N, DO   5 mg at 04/19/23 1359   lisinopril  (ZESTRIL ) tablet 20 mg  20 mg Oral Daily Krishnan, Gokul, MD   20 mg at 04/19/23 9093   LORazepam  (ATIVAN ) injection 1 mg  1 mg Intravenous Q4H PRN Krishnan, Gokul, MD   1 mg at 04/18/23 2023   melatonin tablet 5 mg  5 mg Oral QHS Cox, Amy N, DO   5 mg at 04/17/23 2151   ondansetron  (ZOFRAN ) tablet 4 mg  4 mg Oral Q6H PRN Cox, Amy N, DO       Or   ondansetron  (ZOFRAN ) injection 4 mg  4 mg Intravenous Q6H PRN Cox, Amy N, DO       Current Outpatient Medications  Medication Sig Dispense Refill   Apoaequorin 10 MG CAPS Take 1 tablet by mouth daily.     ascorbic acid   (VITAMIN C ) 1000 MG tablet Take 1,000 mg by mouth daily.     aspirin EC 81 MG tablet Take 81 mg by mouth daily. Swallow whole.     colchicine  0.6 MG tablet Take 0.6 mg by mouth daily.     febuxostat  (ULORIC ) 40 MG tablet Take 40 mg by mouth daily.     fluticasone (FLONASE) 50 MCG/ACT nasal spray Place 2 sprays into both nostrils daily as needed for rhinitis or allergies.     lisinopril  (ZESTRIL ) 20 MG tablet Take 20 mg by mouth daily.     polyethylene glycol (MIRALAX ) 17 g packet Take 17 g by mouth daily. 30 each 0   PRALUENT 75 MG/ML SOAJ Inject 75 mg into the skin as directed. 75 mg under skin every 14 days     senna-docusate (SENOKOT-S) 8.6-50 MG tablet Take 2 tablets by mouth at bedtime. 60 tablet 2     Discharge Medications: Please see discharge summary for a list of discharge medications.  Relevant Imaging Results:  Relevant Lab Results:   Additional Information SS#: [id number]. Patient has mittens from pulling lines and his diaper. Wife wants him to return home after rehab.  Lauraine JAYSON Carpen, LCSW

## 2023-04-19 NOTE — ED Notes (Signed)
 Wife requesting something be given to help calm patient but is requesting that it not be "too much" as she doesn't want him overly sedated for PT today. Provider aware and will provided orders if able to.

## 2023-04-19 NOTE — TOC Initial Note (Signed)
 Transition of Care [facility]) - Initial/Assessment Note    Patient Details  Name: Adrian Aguilar MRN: [id number] Date of Birth: Sep 04, 1932  [facility] CM/SW Contact:    Lauraine JAYSON Carpen, LCSW Phone Number: 04/19/2023, 2:23 PM  Clinical Narrative:  Patient is only oriented to self. Wife at bedside. CSW introduced role and explained that PT recommendations would be discussed. Wife is agreeable to SNF placement. First preference is [facility]. CSW left a message for the admissions coordinator to notify. Patient currently has mittens due to pulling lines and his diaper. Wife and RN are aware he has to be without mittens for at least 24 hours before he can discharge to SNF.                Expected Discharge Plan: Skilled Nursing Facility Barriers to Discharge: Continued Medical Work up   Patient Goals and CMS Choice            Expected Discharge Plan and Services     Post Acute Care Choice: Skilled Nursing Facility Living arrangements for the past 2 months: Single Family Home                                      Prior Living Arrangements/Services Living arrangements for the past 2 months: Single Family Home Lives with:: Spouse Patient language and need for interpreter reviewed:: Yes Do you feel safe going back to the place where you live?: Yes      Need for Family Participation in Patient Care: Yes (Comment) Care giver support system in place?: Yes (comment)   Criminal Activity/Legal Involvement Pertinent to Current Situation/Hospitalization: No - Comment as needed  Activities of Daily Living      Permission Sought/Granted Permission sought to share information with : Facility Industrial/product Designer granted to share information with : Yes, Verbal Permission Granted  Share Information with NAME: Derrill Bagnell     Permission granted to share info w Relationship: Husband  Permission granted to share info w Contact Information:  [phone]  Emotional Assessment Appearance:: Appears stated age Attitude/Demeanor/Rapport: Unable to Assess Affect (typically observed): Unable to Assess Orientation: : Oriented to Self Alcohol / Substance Use: Not Applicable Psych Involvement: No (comment)  Admission diagnosis:  Altered mental status [R41.82] AMS (altered mental status) [R41.82] Patient Active Problem List   Diagnosis Date Noted   AMS (altered mental status) 04/18/2023   Altered mental status 04/17/2023   Essential hypertension 04/17/2023   CKD stage 3b, GFR 30-44 ml/min (HCC) 04/17/2023   Dementia (HCC) 04/17/2023   Abdominal aortic aneurysm (AAA) (HCC) 04/17/2023   PVD (peripheral vascular disease) (HCC) 04/17/2023   Constipation 04/17/2023   Leukocytosis 04/17/2023   PCP:  Lemon Lamar Flavors, MD Pharmacy:   [facility] [address] [facility] - [place], [place] - [address] AT [facility] [address] [place] [address] Phone: [phone] Fax: [phone]     Social Drivers of Health (SDOH) Social History: SDOH Screenings   Food Insecurity: No Food Insecurity (03/08/2022)   Received from [facility] Needs: No Transportation Needs (02/24/2021)   Received from [facility]  Financial Resource Strain: Low Risk  (03/08/2022)   Received from [facility]  Physical Activity: Unknown (03/08/2022)   Received from [facility]  Social Connections: Socially Isolated (03/08/2022)   Received from [facility]  Stress: No Stress Concern Present (03/08/2022)  Received from [facility]  Tobacco Use: Low Risk  (04/17/2023)   SDOH Interventions:     Readmission Risk Interventions     No data to display

## 2023-04-19 NOTE — Evaluation (Signed)
 Physical Therapy Evaluation Patient Details Name: Adrian Aguilar MRN: [id number] DOB: 04-28-1932 Today's Date: 04/19/2023  History of Present Illness  Pt is an 89 year old male with history of CKD 3B, PAD status post aortic bifurcation stenting, hypertension, who presents emergency department for chief concerns of altered mental status and fall.  MD assessment includes: acute metabolic encephalopathy in the setting of known dementia, leukocytosis, and severe constipation.   Clinical Impression  Pt pleasant and put forth good effort during the session.  Pt presented with significant functional weakness and instability both in sitting and standing.  Once sitting at the EOB pt presented with R posterolateral lean that improved somewhat with weight shifting activities.  Pt required at times heavy +2 physical assistance with functional tasks per below and required constant physical assistance in standing to prevent LOB/fall.  Pt is at an extremely high risk for falls and will benefit from continued PT services upon discharge to safely address deficits listed in patient problem list for decreased caregiver assistance and eventual return to PLOF.         If plan is discharge home, recommend the following: Two people to help with walking and/or transfers;A lot of help with bathing/dressing/bathroom;Assistance with cooking/housework;Direct supervision/assist for medications management;Assist for transportation;Help with stairs or ramp for entrance   Can travel by private vehicle   No    Equipment Recommendations Rolling walker (2 wheels);BSC/3in1;Other (comment) (TBD at next venue of care)  Recommendations for Other Services       Functional Status Assessment Patient has had a recent decline in their functional status and demonstrates the ability to make significant improvements in function in a reasonable and predictable amount of time.     Precautions / Restrictions Precautions Precautions:  Fall Restrictions Weight Bearing Restrictions Per Provider Order: No      Mobility  Bed Mobility Overal bed mobility: Needs Assistance Bed Mobility: Supine to Sit, Sit to Supine     Supine to sit: Mod assist Sit to supine: +2 for physical assistance, Mod assist   General bed mobility comments: Assist needed for BLE and trunk control    Transfers Overall transfer level: Needs assistance Equipment used: Rolling walker (2 wheels) Transfers: Sit to/from Stand Sit to Stand: Mod assist, From elevated surface, +2 physical assistance           General transfer comment: Pt required heavy assist to come to standing and to prevent fall while in standing    Ambulation/Gait Ambulation/Gait assistance: Mod assist, +2 physical assistance Gait Distance (Feet): 1 Feet Assistive device: Rolling walker (2 wheels) Gait Pattern/deviations: Step-to pattern, Trunk flexed, Shuffle, Decreased step length - right, Decreased step length - left Gait velocity: decreased     General Gait Details: Pt able to take 2-3 small, effortful, shuffling steps with heavy +2 assist for stabiltiy and to guide the RW  Stairs            Wheelchair Mobility     Tilt Bed    Modified Rankin (Stroke Patients Only)       Balance Overall balance assessment: Needs assistance, History of Falls   Sitting balance-Leahy Scale: Poor Sitting balance - Comments: R posterolateral lean Postural control: Posterior lean, Right lateral lean Standing balance support: Bilateral upper extremity supported, During functional activity, Reliant on assistive device for balance Standing balance-Leahy Scale: Poor  Pertinent Vitals/Pain Pain Assessment Pain Assessment: No/denies pain    Home Living Family/patient expects to be discharged to:: Private residence Living Arrangements: Spouse/significant other;Non-relatives/Friends (sister-in-law) Available Help at Discharge:  Family;Available 24 hours/day Type of Home: House Home Access: Stairs to enter Entrance Stairs-Rails: None Entrance Stairs-Number of Steps: 2 Alternate Level Stairs-Number of Steps: 13 Home Layout: Two level;Able to live on main level with bedroom/bathroom Home Equipment: None Additional Comments: Spouse present and assisted with history secondary to pt being a poor historian    Prior Function Prior Level of Function : Needs assist             Mobility Comments: Ind amb without an AD community distances, 1-2 falls in the last 6 months with recent fall secondary to weakness and legs giving out ADLs Comments: Pt Ind with bathing and dressing with spouse assisting with meals, meds, and transportation     Extremity/Trunk Assessment   Upper Extremity Assessment Upper Extremity Assessment: Generalized weakness    Lower Extremity Assessment Lower Extremity Assessment: Generalized weakness       Communication   Communication Communication: Hearing impairment Cueing Techniques: Verbal cues;Tactile cues;Visual cues  Cognition Arousal: Alert Behavior During Therapy: WFL for tasks assessed/performed Overall Cognitive Status: History of cognitive impairments - at baseline                                          General Comments      Exercises     Assessment/Plan    PT Assessment Patient needs continued PT services  PT Problem List Decreased strength;Decreased activity tolerance;Decreased balance;Decreased mobility;Decreased knowledge of use of DME       PT Treatment Interventions DME instruction;Gait training;Stair training;Functional mobility training;Therapeutic activities;Therapeutic exercise;Balance training;Patient/family education    PT Goals (Current goals can be found in the Care Plan section)  Acute Rehab PT Goals Patient Stated Goal: To get stronger PT Goal Formulation: With family Time For Goal Achievement: 05/02/23 Potential to Achieve  Goals: Good    Frequency Min 1X/week     Co-evaluation               AM-PAC PT 6 Clicks Mobility  Outcome Measure Help needed turning from your back to your side while in a flat bed without using bedrails?: A Lot Help needed moving from lying on your back to sitting on the side of a flat bed without using bedrails?: A Lot Help needed moving to and from a bed to a chair (including a wheelchair)?: A Lot Help needed standing up from a chair using your arms (e.g., wheelchair or bedside chair)?: A Lot Help needed to walk in hospital room?: Total Help needed climbing 3-5 steps with a railing? : Total 6 Click Score: 10    End of Session Equipment Utilized During Treatment: Gait belt Activity Tolerance: Patient tolerated treatment well Patient left: in bed;with call bell/phone within reach;with family/visitor present (bilateral rails up in ED bed with seizure pads up as found) Nurse Communication: Mobility status PT Visit Diagnosis: History of falling (Z91.81);Unsteadiness on feet (R26.81);Difficulty in walking, not elsewhere classified (R26.2);Muscle weakness (generalized) (M62.81)    Time: 9094-9065 PT Time Calculation (min) (ACUTE ONLY): 29 min   Charges:   PT Evaluation $PT Eval Moderate Complexity: 1 Mod   PT General Charges $$ ACUTE PT VISIT: 1 Visit       D. Glendia Bertin PT, DPT 04/19/23,  11:14 AM

## 2023-04-19 NOTE — ED Notes (Addendum)
 Pt moving around in bed and scraped his R shin on bed rails. Slight bleeding. Seizure pads applied to side rails for patients safety. Nonadhesive dressing placed on shin and wrapped in gauze

## 2023-04-19 NOTE — ED Notes (Signed)
 Report pt awaits placement for ecf pt reported to have dementia

## 2023-04-19 NOTE — ED Notes (Signed)
 Pt transported to 218 per RN pt spouse with pt information provided to receiving RN pt remains in mitts as pt spouse places mitts on pt pt noted to be confused and attempting to dislodge ordered invasive lines pure wick in place PIV to LFA patent secured cdi not infiltrated @ this time

## 2023-04-19 NOTE — ED Notes (Signed)
 Brief and under pads changed. Pt tolerated well. Wife remains at bedside. Pt was able to take a couple sips of water under wife direction. Pt tolerated water well.

## 2023-04-19 NOTE — ED Notes (Signed)
 Patient had BM. Stool soft and brown. Perineal area cleaned with bath wipes. New incontinence brief placed and new absorbent pads placed. Clean warm blankets given. Family at bedside.

## 2023-04-19 NOTE — ED Notes (Signed)
 Pt reports pain while trying to obtain blood off PIV site in left [facility] and left forearm. Left AC IV d/c. Tip intact. No infiltration noted. Unable to obtain blood from PIV site on left forearm but flushes without difficulty. PIV site established on right forearm. Blood obtained. Flushed with ease. Site covered with coban, wife remains at bedside. Bed laid back and lights down to provided quite calm environment.

## 2023-04-20 DIAGNOSIS — R4182 Altered mental status, unspecified: Secondary | ICD-10-CM | POA: Diagnosis not present

## 2023-04-20 LAB — CBC WITH DIFFERENTIAL/PLATELET
Abs Immature Granulocytes: 0.07 10*3/uL (ref 0.00–0.07)
Basophils Absolute: 0 10*3/uL (ref 0.0–0.1)
Basophils Relative: 0 %
Eosinophils Absolute: 0 10*3/uL (ref 0.0–0.5)
Eosinophils Relative: 0 %
HCT: 40.5 % (ref 39.0–52.0)
Hemoglobin: 14.4 g/dL (ref 13.0–17.0)
Immature Granulocytes: 1 %
Lymphocytes Relative: 5 %
Lymphs Abs: 0.8 10*3/uL (ref 0.7–4.0)
MCH: 29.8 pg (ref 26.0–34.0)
MCHC: 35.6 g/dL (ref 30.0–36.0)
MCV: 83.9 fL (ref 80.0–100.0)
Monocytes Absolute: 1.4 10*3/uL — ABNORMAL HIGH (ref 0.1–1.0)
Monocytes Relative: 9 %
Neutro Abs: 13.1 10*3/uL — ABNORMAL HIGH (ref 1.7–7.7)
Neutrophils Relative %: 85 %
Platelets: 311 10*3/uL (ref 150–400)
RBC: 4.83 MIL/uL (ref 4.22–5.81)
RDW: 12.6 % (ref 11.5–15.5)
WBC: 15.3 10*3/uL — ABNORMAL HIGH (ref 4.0–10.5)
nRBC: 0 % (ref 0.0–0.2)

## 2023-04-20 LAB — URINALYSIS, COMPLETE (UACMP) WITH MICROSCOPIC
Bilirubin Urine: NEGATIVE
Glucose, UA: NEGATIVE mg/dL
Ketones, ur: NEGATIVE mg/dL
Nitrite: NEGATIVE
Protein, ur: 30 mg/dL — AB
Specific Gravity, Urine: 1.013 (ref 1.005–1.030)
pH: 6 (ref 5.0–8.0)

## 2023-04-20 LAB — BASIC METABOLIC PANEL
Anion gap: 8 (ref 5–15)
BUN: 22 mg/dL (ref 8–23)
CO2: 24 mmol/L (ref 22–32)
Calcium: 9.4 mg/dL (ref 8.9–10.3)
Chloride: 105 mmol/L (ref 98–111)
Creatinine, Ser: 1.67 mg/dL — ABNORMAL HIGH (ref 0.61–1.24)
GFR, Estimated: 39 mL/min — ABNORMAL LOW (ref >60)
Glucose, Bld: 152 mg/dL — ABNORMAL HIGH (ref 70–99)
Potassium: 4.3 mmol/L (ref 3.5–5.1)
Sodium: 137 mmol/L (ref 135–145)

## 2023-04-20 MED ORDER — CHLORHEXIDINE GLUCONATE CLOTH 2 % EX PADS
6.0000 | MEDICATED_PAD | Freq: Every day | CUTANEOUS | Status: DC
  Administered 2023-04-20 – 2023-04-22 (×3): 6 via TOPICAL

## 2023-04-20 NOTE — TOC Progression Note (Signed)
 Transition of Care [facility]) - Progression Note    Patient Details  Name: Adrian Aguilar MRN: [id number] Date of Birth: 12-23-1932  Transition of Care [facility]) CM/SW Contact  Corean ONEIDA Haddock, RN Phone Number: 04/20/2023, 2:35 PM  Clinical Narrative:     Patient still with mitts [facility] still pending in the [place].  Message sent to admissions requesting review   Expected Discharge Plan: Skilled Nursing Facility Barriers to Discharge: Continued Medical Work up  Expected Discharge Plan and Services     Post Acute Care Choice: Skilled Nursing Facility Living arrangements for the past 2 months: Single Family Home                                       Social Determinants of Health (SDOH) Interventions SDOH Screenings   Food Insecurity: No Food Insecurity (04/20/2023)  Housing: Low Risk  (04/20/2023)  Transportation Needs: No Transportation Needs (04/20/2023)  Utilities: Not At Risk (04/20/2023)  Financial Resource Strain: Low Risk  (03/08/2022)   Received from [facility]  Physical Activity: Unknown (03/08/2022)   Received from [facility]  Social Connections: Moderately Isolated (04/20/2023)  Stress: No Stress Concern Present (03/08/2022)   Received from [facility]  Tobacco Use: Low Risk  (04/17/2023)    Readmission Risk Interventions     No data to display

## 2023-04-20 NOTE — Progress Notes (Addendum)
 Progress Note   Patient: Adrian Aguilar FMW:[id number] DOB: Aug 08, 1932 DOA: 04/17/2023     3 DOS: the patient was seen and examined on 04/20/2023     Brief hospital course:  89 year old male with history of CKD 3B, PAD status post aortic bifurcation stenting, hypertension, who presents emergency department for chief concerns of altered mental status and increasing fall.  Infectious workup was unremarkable.  Patient was empirically placed on antibacterials.  He was also complaining of abdominal pain which prompted a CT scan which showed severe constipation.     Assessment/Plan:   Acute metabolic encephalopathy in the setting of known dementia Likely brought on by severe constipation.  No other etiology has been found.  CT scan of the head did not show any acute findings.  He has had several bowel movements overnight and has experienced an improvement in his mentation per his wife.  Could be back to his baseline now.  No obvious focal neurological deficits. Continue to monitor neurochecks Currently on as needed antipsychotics TOC working on placement PT OT recommending skilled nursing facility placement Status improved today compared to yesterday   Acute urinary retention requiring Foley catheterization Continue Foley cath Patient will need outpatient urology follow-up  Leukocytosis-noted Follow-up on urinalysis No antibiotics indicated at this time   Severe constipation Continue laxatives   History of peripheral artery disease/abdominal aortic aneurysm. Stable.   Chronic kidney disease stage IIIb Continue to monitor renal function   Essential hypertension Stable.  Supplement potassium.   Foley catheter This was placed when he was brought in with altered mental status.  Voiding trial to be done prior to discharge.     DVT Prophylaxis: Subcutaneous heparin  Code Status: Full code Family Communication: Discussed with patient Disposition Plan: Hopefully return home this  afternoon as per his wife's wishes     Subjective:  Patient seen and examined this morning WBC slightly worsened Order placed for urinalysis Patient not complaining of cough or abdominal pain   Physical Exam: General appearance: Awake alert.  In no distress.  Pleasantly confused Resp: Clear to auscultation bilaterally.  Normal effort Cardio: S1-S2 is normal regular.  No S3-S4.  No rubs murmurs or bruit GI: Abdomen is soft.  Nontender nondistended.  Bowel sounds are present normal.  No masses organomegaly Extremities: No edema.  Full range of motion of lower extremities. Neurologic:   No focal neurological deficits.     Data Reviewed:     Latest Ref Rng & Units 04/20/2023    5:29 AM 04/19/2023    4:00 AM 04/18/2023    3:12 AM  CBC  WBC 4.0 - 10.5 K/uL 15.3  9.5  9.1   Hemoglobin 13.0 - 17.0 g/dL 85.5  86.0  86.5   Hematocrit 39.0 - 52.0 % 40.5  39.4  38.9   Platelets 150 - 400 K/uL 311  254  242        Latest Ref Rng & Units 04/20/2023    5:29 AM 04/19/2023    4:00 AM 04/18/2023    3:12 AM  BMP  Glucose 70 - 99 mg/dL 847  878  893   BUN 8 - 23 mg/dL 22  24  26    Creatinine 0.61 - 1.24 mg/dL 8.32  8.55  8.58   Sodium 135 - 145 mmol/L 137  138  137   Potassium 3.5 - 5.1 mmol/L 4.3  4.3  3.5   Chloride 98 - 111 mmol/L 105  106  106   CO2 22 -  32 mmol/L 24  20  24    Calcium 8.9 - 10.3 mg/dL 9.4  9.3  9.4     Vitals:   04/19/23 2234 04/20/23 0817 04/20/23 0818 04/20/23 0916  BP:  (!) 205/104 (!) 191/82 (!) 149/63  Pulse:  83 91 92  Resp:  16  17  Temp:  98.2 F (36.8 C) 98.2 F (36.8 C) 98.3 F (36.8 C)  TempSrc:    Axillary  SpO2:  95%  97%  Weight: 83.1 kg     Height: 5' 11 (1.803 m)        Author: Drue ONEIDA Potter, MD 04/20/2023 3:11 PM  For on call review [email or web address].

## 2023-04-20 NOTE — Plan of Care (Signed)
  Problem: Education: Goal: Knowledge of General Education information will improve Description: Including pain rating scale, medication(s)/side effects and non-pharmacologic comfort measures Outcome: Not Progressing Variance Physical/mental limitations Impact: Moderate Note: Wife verbalized understanding about the the plan of care    Problem: Safety: Goal: Ability to remain free from injury will improve Outcome: Progressing   Problem: Skin Integrity: Goal: Risk for impaired skin integrity will decrease Outcome: Progressing

## 2023-04-20 NOTE — Care Management Important Message (Signed)
 Important Message  Patient Details  Name: Adrian Aguilar MRN: [id number] Date of Birth: 03/08/1933   Important Message Given:  Yes - Medicare IM     Sherilyn Banker 04/20/2023, 11:20 AM

## 2023-04-21 DIAGNOSIS — R4182 Altered mental status, unspecified: Secondary | ICD-10-CM | POA: Diagnosis not present

## 2023-04-21 LAB — CBC WITH DIFFERENTIAL/PLATELET
Abs Immature Granulocytes: 0.06 10*3/uL (ref 0.00–0.07)
Basophils Absolute: 0 10*3/uL (ref 0.0–0.1)
Basophils Relative: 0 %
Eosinophils Absolute: 0.4 10*3/uL (ref 0.0–0.5)
Eosinophils Relative: 3 %
HCT: 37.5 % — ABNORMAL LOW (ref 39.0–52.0)
Hemoglobin: 13 g/dL (ref 13.0–17.0)
Immature Granulocytes: 1 %
Lymphocytes Relative: 14 %
Lymphs Abs: 1.7 10*3/uL (ref 0.7–4.0)
MCH: 29.4 pg (ref 26.0–34.0)
MCHC: 34.7 g/dL (ref 30.0–36.0)
MCV: 84.8 fL (ref 80.0–100.0)
Monocytes Absolute: 1.5 10*3/uL — ABNORMAL HIGH (ref 0.1–1.0)
Monocytes Relative: 12 %
Neutro Abs: 8.5 10*3/uL — ABNORMAL HIGH (ref 1.7–7.7)
Neutrophils Relative %: 70 %
Platelets: 281 10*3/uL (ref 150–400)
RBC: 4.42 MIL/uL (ref 4.22–5.81)
RDW: 12.8 % (ref 11.5–15.5)
WBC: 12.2 10*3/uL — ABNORMAL HIGH (ref 4.0–10.5)
nRBC: 0 % (ref 0.0–0.2)

## 2023-04-21 LAB — BASIC METABOLIC PANEL
Anion gap: 9 (ref 5–15)
BUN: 30 mg/dL — ABNORMAL HIGH (ref 8–23)
CO2: 22 mmol/L (ref 22–32)
Calcium: 8.8 mg/dL — ABNORMAL LOW (ref 8.9–10.3)
Chloride: 106 mmol/L (ref 98–111)
Creatinine, Ser: 2.18 mg/dL — ABNORMAL HIGH (ref 0.61–1.24)
GFR, Estimated: 28 mL/min — ABNORMAL LOW (ref >60)
Glucose, Bld: 103 mg/dL — ABNORMAL HIGH (ref 70–99)
Potassium: 4.2 mmol/L (ref 3.5–5.1)
Sodium: 137 mmol/L (ref 135–145)

## 2023-04-21 LAB — CULTURE, BLOOD (ROUTINE X 2)

## 2023-04-21 MED ORDER — SODIUM CHLORIDE 0.9 % IV BOLUS
1000.0000 mL | Freq: Once | INTRAVENOUS | Status: AC
  Administered 2023-04-21: 1000 mL via INTRAVENOUS

## 2023-04-21 MED ORDER — COLCHICINE 0.3 MG HALF TABLET
0.3000 mg | ORAL_TABLET | Freq: Every day | ORAL | Status: DC
  Administered 2023-04-21 – 2023-04-22 (×2): 0.3 mg via ORAL
  Filled 2023-04-21 (×2): qty 1

## 2023-04-21 NOTE — Plan of Care (Signed)

## 2023-04-21 NOTE — Progress Notes (Signed)
 Progress Note   Patient: Adrian Aguilar ZOX:[id number] DOB: 01-Nov-1932 DOA: 04/17/2023     4 DOS: the patient was seen and examined on 04/21/2023     Brief hospital course:  89 year old male with history of CKD 3B, PAD status post aortic bifurcation stenting, hypertension, who presents emergency department for chief concerns of altered mental status and increasing fall.  Infectious workup was unremarkable.  Patient was empirically placed on antibacterials.  He was also complaining of abdominal pain which prompted a CT scan which showed severe constipation.     Assessment/Plan:   Acute metabolic encephalopathy in the setting of known dementia Likely brought on by severe constipation.  No other etiology has been found.  CT scan of the head did not show any acute findings.  He has had several bowel movements overnight and has experienced an improvement in his mentation per his wife.  Could be back to his baseline now.  No obvious focal neurological deficits. Continue to monitor neurochecks Currently on as needed antipsychotics PT recommending rehab [facility] manager working on this   Acute urinary retention requiring Foley catheterization Acute kidney injury Continue Foley cath Patient will need outpatient urology follow-up Continue IV fluid Monitor renal function closely   Leukocytosis-noted Follow-up on urinalysis No antibiotics indicated at this time   Severe constipation Continue laxatives   History of peripheral artery disease/abdominal aortic aneurysm. Stable.   Chronic kidney disease stage IIIb Continue to monitor renal function   Essential hypertension Stable.  Supplement potassium.   Foley catheter This was placed when he was brought in with altered mental status.  Voiding trial to be done prior to discharge.     DVT Prophylaxis: Subcutaneous heparin  Code Status: Full code Family Communication: Discussed with patient Disposition Plan: Hopefully return home this  afternoon as per his wife's wishes     Subjective:  Patient seen and examined this morning WBC improving Mental status significantly improved today Denies abdominal pain chest pain or cough Not having any symptoms of UTI at this point   Physical Exam: General appearance: Awake alert.  In no distress.  Pleasantly confused Resp: Clear to auscultation bilaterally.  Normal effort Cardio: S1-S2 is normal regular.  No S3-S4.  No rubs murmurs or bruit GI: Abdomen is soft.  Nontender nondistended.  Bowel sounds are present normal.  No masses organomegaly Extremities: No edema.  Full range of motion of lower extremities. Neurologic:   No focal neurological deficits.     Data Reviewed:    Latest Ref Rng & Units 04/21/2023    5:23 AM 04/20/2023    5:29 AM 04/19/2023    4:00 AM  CBC  WBC 4.0 - 10.5 K/uL 12.2  15.3  9.5   Hemoglobin 13.0 - 17.0 g/dL 81.1  91.4  78.2   Hematocrit 39.0 - 52.0 % 37.5  40.5  39.4   Platelets 150 - 400 K/uL 281  311  254        Latest Ref Rng & Units 04/21/2023    5:23 AM 04/20/2023    5:29 AM 04/19/2023    4:00 AM  BMP  Glucose 70 - 99 mg/dL 956  213  086   BUN 8 - 23 mg/dL 30  22  24    Creatinine 0.61 - 1.24 mg/dL 5.78  4.69  6.29   Sodium 135 - 145 mmol/L 137  137  138   Potassium 3.5 - 5.1 mmol/L 4.2  4.3  4.3   Chloride 98 - 111 mmol/L  106  105  106   CO2 22 - 32 mmol/L 22  24  20    Calcium 8.9 - 10.3 mg/dL 8.8  9.4  9.3     Vitals:   04/20/23 1610 04/20/23 2002 04/21/23 0444 04/21/23 1013  BP: (!) 179/94 (!) 151/66 (!) 139/56 (!) 164/68  Pulse: 94 88 78 86  Resp: 16 18 17 20   Temp: 98.5 F (36.9 C) 99 F (37.2 C) 99.2 F (37.3 C) 98.7 F (37.1 C)  TempSrc:  Oral Oral Oral  SpO2: 97% 95% 100% 95%  Weight:      Height:       Disposition: Patient currently having AKI needing IV fluid and then subsequent clearance before going to rehab  Author: Ezzard Holms, MD 04/21/2023 5:32 PM  For on call review [email or web address].

## 2023-04-21 NOTE — TOC Progression Note (Signed)
 Transition of Care [facility]) - Progression Note    Patient Details  Name: Adrian Aguilar MRN: [id number] Date of Birth: Sep 29, 1932  Transition of Care [facility]) CM/SW Contact  Loman Risk, RN Phone Number: 04/21/2023, 2:26 PM  Clinical Narrative:     Met with patient and wife at bedside.  Provided bed offers [facility] with [facility] to start auth  Expected Discharge Plan: Skilled Nursing Facility Barriers to Discharge: Continued Medical Work up  Expected Discharge Plan and Services     Post Acute Care Choice: Skilled Nursing Facility Living arrangements for the past 2 months: Single Family Home                                       Social Determinants of Health (SDOH) Interventions SDOH Screenings   Food Insecurity: No Food Insecurity (04/20/2023)  Housing: Low Risk  (04/20/2023)  Transportation Needs: No Transportation Needs (04/20/2023)  Utilities: Not At Risk (04/20/2023)  Financial Resource Strain: Low Risk  (03/08/2022)   Received from [facility]  Physical Activity: Unknown (03/08/2022)   Received from [facility]  Social Connections: Moderately Isolated (04/20/2023)  Stress: No Stress Concern Present (03/08/2022)   Received from [facility]  Tobacco Use: Low Risk  (04/17/2023)    Readmission Risk Interventions     No data to display

## 2023-04-21 NOTE — Progress Notes (Signed)
 Physical Therapy Treatment Patient Details Name: Adrian Aguilar MRN: [id number] DOB: 09/21/1932 Today's Date: 04/21/2023   History of Present Illness Pt is an 89 year old male with history of CKD 3B, PAD status post aortic bifurcation stenting, hypertension, who presents emergency department for chief concerns of altered mental status and fall.  MD assessment includes: acute metabolic encephalopathy in the setting of known dementia, leukocytosis, and severe constipation.    PT Comments  Pt was pleasant and motivated to participate during the session and put forth good effort throughout. Pt required extra time and effort and cuing for sequencing but no physical assistance during sup to sit.  Pt presented with improved functional strength and stability with static sitting and transfers compared to prior session and once up was able to amb 12 feet with min A for stability and to manage the RW.  Pt will benefit from continued PT services upon discharge to safely address deficits listed in patient problem list for decreased caregiver assistance and eventual return to PLOF.      If plan is discharge home, recommend the following: A lot of help with walking and/or transfers;A lot of help with bathing/dressing/bathroom;Assistance with cooking/housework;Direct supervision/assist for medications management;Assist for transportation;Help with stairs or ramp for entrance   Can travel by private vehicle     No  Equipment Recommendations  Rolling walker (2 wheels);BSC/3in1;Other (comment) (TBD at next venue of care)    Recommendations for Other Services       Precautions / Restrictions Precautions Precautions: Fall Restrictions Weight Bearing Restrictions Per Provider Order: No     Mobility  Bed Mobility Overal bed mobility: Needs Assistance       Supine to sit: Supervision     General bed mobility comments: Extra time, effort, and cuing for sequencing    Transfers Overall transfer  level: Needs assistance Equipment used: Rolling walker (2 wheels) Transfers: Sit to/from Stand Sit to Stand: Contact guard assist           General transfer comment: Min to mod verbal cues for sequencing/hand placement with extra time and effort needed to come to full upright standing    Ambulation/Gait Ambulation/Gait assistance: Min assist Gait Distance (Feet): 8 Feet x 1, 12 Feet x 1 Assistive device: Rolling walker (2 wheels) Gait Pattern/deviations: Step-through pattern, Decreased step length - right, Decreased step length - left, Shuffle, Trunk flexed Gait velocity: decreased     General Gait Details: Slow, effortful steps with short bilateral step length and occasional shuffling, min A for general stabiltiy and to control the RW   Stairs             Wheelchair Mobility     Tilt Bed    Modified Rankin (Stroke Patients Only)       Balance Overall balance assessment: Needs assistance, History of Falls   Sitting balance-Leahy Scale: Good     Standing balance support: Bilateral upper extremity supported, During functional activity, Reliant on assistive device for balance Standing balance-Leahy Scale: Poor                              Cognition Arousal: Alert Behavior During Therapy: WFL for tasks assessed/performed Overall Cognitive Status: History of cognitive impairments - at baseline  Exercises Total Joint Exercises Ankle Circles/Pumps: AROM, Strengthening, Both, 10 reps Quad Sets: Strengthening, Both, 10 reps Gluteal Sets: Strengthening, Both, 5 reps Long Arc Quad: Strengthening, Both, 5 reps Marching in Standing: Strengthening, Both, 5 reps, Seated Other Exercises Other Exercises: Sit to/from stand x 5 for therex with cues to control eccentric phase    General Comments        Pertinent Vitals/Pain Pain Assessment Pain Assessment: No/denies pain    Home Living                           Prior Function            PT Goals (current goals can now be found in the care plan section) Progress towards PT goals: Progressing toward goals    Frequency    Min 1X/week      PT Plan      Co-evaluation              AM-PAC PT "6 Clicks" Mobility   Outcome Measure  Help needed turning from your back to your side while in a flat bed without using bedrails?: A Little Help needed moving from lying on your back to sitting on the side of a flat bed without using bedrails?: A Little Help needed moving to and from a bed to a chair (including a wheelchair)?: A Little Help needed standing up from a chair using your arms (e.g., wheelchair or bedside chair)?: A Little Help needed to walk in hospital room?: A Little Help needed climbing 3-5 steps with a railing? : A Lot 6 Click Score: 17    End of Session Equipment Utilized During Treatment: Gait belt Activity Tolerance: Patient tolerated treatment well Patient left: in chair;with call bell/phone within reach;with chair alarm set;with family/visitor present Nurse Communication: Mobility status PT Visit Diagnosis: History of falling (Z91.81);Unsteadiness on feet (R26.81);Difficulty in walking, not elsewhere classified (R26.2);Muscle weakness (generalized) (M62.81)     Time: 1610-9604 PT Time Calculation (min) (ACUTE ONLY): 23 min  Charges:    $Gait Training: 8-22 mins $Therapeutic Exercise: 8-22 mins PT General Charges $$ ACUTE PT VISIT: 1 Visit                     D. Scott Tameah Mihalko PT, DPT 04/21/23, 12:00 PM

## 2023-04-22 DIAGNOSIS — R4182 Altered mental status, unspecified: Secondary | ICD-10-CM | POA: Diagnosis not present

## 2023-04-22 LAB — BASIC METABOLIC PANEL
Anion gap: 9 (ref 5–15)
BUN: 33 mg/dL — ABNORMAL HIGH (ref 8–23)
CO2: 21 mmol/L — ABNORMAL LOW (ref 22–32)
Calcium: 8.8 mg/dL — ABNORMAL LOW (ref 8.9–10.3)
Chloride: 106 mmol/L (ref 98–111)
Creatinine, Ser: 1.71 mg/dL — ABNORMAL HIGH (ref 0.61–1.24)
GFR, Estimated: 38 mL/min — ABNORMAL LOW (ref >60)
Glucose, Bld: 108 mg/dL — ABNORMAL HIGH (ref 70–99)
Potassium: 3.8 mmol/L (ref 3.5–5.1)
Sodium: 136 mmol/L (ref 135–145)

## 2023-04-22 LAB — CBC WITH DIFFERENTIAL/PLATELET
Abs Immature Granulocytes: 0.05 10*3/uL (ref 0.00–0.07)
Basophils Absolute: 0 10*3/uL (ref 0.0–0.1)
Basophils Relative: 1 %
Eosinophils Absolute: 0.4 10*3/uL (ref 0.0–0.5)
Eosinophils Relative: 6 %
HCT: 36.4 % — ABNORMAL LOW (ref 39.0–52.0)
Hemoglobin: 12.6 g/dL — ABNORMAL LOW (ref 13.0–17.0)
Immature Granulocytes: 1 %
Lymphocytes Relative: 21 %
Lymphs Abs: 1.5 10*3/uL (ref 0.7–4.0)
MCH: 30.1 pg (ref 26.0–34.0)
MCHC: 34.6 g/dL (ref 30.0–36.0)
MCV: 87.1 fL (ref 80.0–100.0)
Monocytes Absolute: 1 10*3/uL (ref 0.1–1.0)
Monocytes Relative: 14 %
Neutro Abs: 4.3 10*3/uL (ref 1.7–7.7)
Neutrophils Relative %: 57 %
Platelets: 244 10*3/uL (ref 150–400)
RBC: 4.18 MIL/uL — ABNORMAL LOW (ref 4.22–5.81)
RDW: 12.7 % (ref 11.5–15.5)
WBC: 7.4 10*3/uL (ref 4.0–10.5)
nRBC: 0 % (ref 0.0–0.2)

## 2023-04-22 LAB — CULTURE, BLOOD (ROUTINE X 2)
Culture: NO GROWTH
Special Requests: ADEQUATE
Special Requests: ADEQUATE

## 2023-04-22 LAB — GLUCOSE, CAPILLARY: Glucose-Capillary: 96 mg/dL (ref 70–99)

## 2023-04-22 MED ORDER — TAMSULOSIN HCL 0.4 MG PO CAPS
0.4000 mg | ORAL_CAPSULE | Freq: Every day | ORAL | Status: DC
  Administered 2023-04-22: 0.4 mg via ORAL
  Filled 2023-04-22: qty 1

## 2023-04-22 MED ORDER — TAMSULOSIN HCL 0.4 MG PO CAPS: 0.4000 mg | ORAL_CAPSULE | Freq: Every day | ORAL | Status: DC

## 2023-04-22 MED ORDER — COLCHICINE 0.6 MG PO TABS: 0.3000 mg | ORAL_TABLET | Freq: Every day | ORAL | Status: DC

## 2023-04-22 NOTE — TOC Transition Note (Signed)
Transition of Care [facility]) - Discharge Note   Patient Details  Name: Adrian Aguilar MRN: [id number] Date of Birth: March 23, 1933  [facility] CM/SW Contact:  Chapman Fitch, RN Phone Number: 04/22/2023, 11:18 AM   Clinical Narrative:     Patient will DC to: [facility]  Anticipated DC date: 04/22/23  Family notified: wife Debra Transport [id number]  Per MD patient ready for DC to . RN, patient, patient's family, and facility notified of DC. Discharge Summary sent to facility. RN given number for report. DC packet on chart. Ambulance transport requested for patient.  TOC signing off.     Barriers to Discharge: Continued Medical Work up   Patient Goals and CMS Choice            Discharge Placement                       Discharge Plan and Services Additional resources added to the After Visit Summary for       Post Acute Care Choice: Skilled Nursing Facility                               Social Drivers of Health (SDOH) Interventions SDOH Screenings   Food Insecurity: No Food Insecurity (04/20/2023)  Housing: Low Risk  (04/20/2023)  Transportation Needs: No Transportation Needs (04/20/2023)  Utilities: Not At Risk (04/20/2023)  Financial Resource Strain: Low Risk  (03/08/2022)   Received from [facility]  Physical Activity: Unknown (03/08/2022)   Received from [facility]  Social Connections: Moderately Isolated (04/20/2023)  Stress: No Stress Concern Present (03/08/2022)   Received from [facility]  Tobacco Use: Low Risk  (04/17/2023)     Readmission Risk Interventions     No data to display

## 2023-04-22 NOTE — Discharge Summary (Addendum)
Physician Discharge Summary   Patient: Adrian Aguilar MRN: [id number] DOB: 30-Dec-1932  Admit date:     04/17/2023  Discharge date: 04/22/23  Discharge Physician: Loyce Dys   PCP: Langley Gauss, MD   Recommendations at discharge:  Follow-up with outpatient urologist concerning your Foley catheter  Discharge Diagnoses: Acute metabolic encephalopathy in the setting of known dementia Acute urinary retention requiring Foley catheterization Acute kidney injury Severe constipation History of peripheral artery disease/abdominal aortic aneurysm. Chronic kidney disease stage IIIb Essential hypertension Foley catheter  Hospital Course: 89 year old male with history of CKD 3B, PAD status post aortic bifurcation stenting, hypertension, who presents emergency department for chief concerns of altered mental status and increasing fall.  Infectious workup was unremarkable.  Patient was empirically placed on antibacterials.  He was also complaining of abdominal pain which prompted a CT scan which showed severe constipation.  Patient has been able to have bowel movement.  Also found to have urinary retention requiring Foley catheter placement.  Patient is to follow-up with urologist as an outpatient with trial of voiding.   Consultants: None Procedures performed: None Disposition: Skilled nursing facility Diet recommendation:  Discharge Diet Orders (From admission, onward)     Start     Ordered   04/18/23 0000  Diet - low sodium heart healthy        04/18/23 1051           Cardiac diet DISCHARGE MEDICATION: Allergies as of 04/22/2023       Reactions   Meat Extract Itching, Other (See Comments), Rash   Red meat   Rosuvastatin Other (See Comments)   SEVERE LEG PAIN   Allopurinol Other (See Comments)   Lethargy   Propofol Other (See Comments)   Other   Shellfish Allergy Other (See Comments)   According to wife. N/V only        Medication List     TAKE these medications     Apoaequorin 10 MG Caps Take 1 tablet by mouth daily.   ascorbic acid 1000 MG tablet Commonly known as: VITAMIN C Take 1,000 mg by mouth daily.   aspirin EC 81 MG tablet Take 81 mg by mouth daily. Swallow whole.   colchicine 0.6 MG tablet Take 0.5 tablets (0.3 mg total) by mouth daily. What changed: how much to take   febuxostat 40 MG tablet Commonly known as: ULORIC Take 40 mg by mouth daily.   fluticasone 50 MCG/ACT nasal spray Commonly known as: FLONASE Place 2 sprays into both nostrils daily as needed for rhinitis or allergies.   lisinopril 20 MG tablet Commonly known as: ZESTRIL Take 20 mg by mouth daily.   polyethylene glycol 17 g packet Commonly known as: MiraLax Take 17 g by mouth daily.   Praluent 75 MG/ML Soaj Generic drug: Alirocumab Inject 75 mg into the skin as directed. 75 mg under skin every 14 days   senna-docusate 8.6-50 MG tablet Commonly known as: Senokot-S Take 2 tablets by mouth at bedtime.        Contact information for follow-up providers     Toborg, Kelby Aline, MD. Schedule an appointment as soon as possible for a visit in 1 week(s).   Specialty: Family Medicine Why: post hospitalization follow up.  Please discuss the incidental finding of lung nodule with your primary care provider. Contact information: [address] [place] [address] [phone]         Sondra Come, MD Follow up.  Specialty: Urology Contact information: [address] [place] [address] [phone]              Contact information for after-discharge care     Destination     [facility] .   Service: Skilled Nursing Contact information: [address] [place] [address] [phone]                    Discharge Exam: Ceasar Mons Weights   04/17/23 1437 04/19/23 2234  Weight: 83 kg 83.1 kg   General appearance: Awake alert.  In no distress Resp: Clear to  auscultation bilaterally.  Normal effort Cardio: S1-S2 is normal regular.  No S3-S4.  No rubs murmurs or bruit GI: Abdomen is soft.  Nontender nondistended.  Bowel sounds are present normal.  No masses organomegaly Extremities: No edema.  Full range of motion of lower extremities. Neurologic:   No focal neurological deficits.  Condition at discharge: good  The results of significant diagnostics from this hospitalization (including imaging, microbiology, ancillary and laboratory) are listed below for reference.   Imaging Studies: CT ABDOMEN PELVIS W CONTRAST Result Date: 04/17/2023 CLINICAL DATA:  Diarrhea.  Abdominal pain. EXAM: CT ABDOMEN AND PELVIS WITH CONTRAST TECHNIQUE: Multidetector CT imaging of the abdomen and pelvis was performed using the standard protocol following bolus administration of intravenous contrast. RADIATION DOSE REDUCTION: This exam was performed according to the departmental dose-optimization program which includes automated exposure control, adjustment of the mA and/or kV according to patient size and/or use of iterative reconstruction technique. CONTRAST:  80mL OMNIPAQUE IOHEXOL 300 MG/ML  SOLN COMPARISON:  None Available. FINDINGS: Lower chest: Mild dependent atelectasis is present both lungs. Lungs otherwise clear. The heart size is normal. Artery calcifications are present. No significant pleural or pericardial effusion is present. Hepatobiliary: No focal liver abnormality is seen. No gallstones, gallbladder wall thickening, or biliary dilatation. Pancreas: Unremarkable. No pancreatic ductal dilatation or surrounding inflammatory changes. Spleen: Normal in size without focal abnormality. Adrenals/Urinary Tract: The adrenal glands are normal bilaterally. A simple cyst at the upper pole of the right kidney measures 3.2 cm. A posterior simple cyst measures 10 mm. Smaller cysts are present at the lower pole of the left kidney. No follow-up imaging is recommended no stone or  solid mass lesion is present. No obstruction is present. The ureters are within normal limits. A Foley catheter is present within the urinary bladder. Stomach/Bowel: The stomach and duodenum are within normal limits. Small bowel is unremarkable. Terminal ileum is within normal limits. The appendix is visualized and normal. The ascending and transverse colon are normal. Diverticular changes are present in the distal descending and at the sigmoid colon. No focal inflammatory changes are present to suggest diverticulitis. Moderate stool distends the rectum to 7.2 cm and transverse diameter. There is some wall thickening and gas around the stool ball without significant inflammatory change. No proximal obstruction is present. Vascular/Lymphatic: An aortic bi-femoral stent graft is in place. Atherosclerotic calcifications are present in the more proximal aorta and branch vessels. Calcifications are present in the external iliac arteries and femoral arteries. 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 Reproductive: Prostate is unremarkable. Other: No abdominal wall hernia or abnormality. No abdominopelvic ascites. Musculoskeletal: Grade 1 degenerative anterolisthesis is present at L4-5. Moderate facet hypertrophy is present bilaterally at L4-5 and L5-S1. No focal osseous lesions are present. The bony pelvis is normal. The hips are located and normal bilaterally. IMPRESSION: 1. Moderate stool distends the rectum to 7.2 cm and  transverse diameter. There is some wall thickening and gas around the stool ball without significant inflammatory change. Stool impaction may be the source of the patient's pain. 2. Descending and sigmoid diverticulosis without diverticulitis. 3. Aortic bi-femoral stent graft in place. 4. Grade 1 degenerative anterolisthesis at L4-5. 5. Moderate facet hypertrophy bilaterally at L4-5 and L5-S1. 6.  Aortic Atherosclerosis (ICD10-I70.0). Electronically Signed   By: Marin Roberts M.D.   On: 04/17/2023 17:37   CT  Head Wo Contrast Result Date: 04/17/2023 CLINICAL DATA:  Altered mental status.  Fall EXAM: CT HEAD WITHOUT CONTRAST CT CERVICAL SPINE WITHOUT CONTRAST TECHNIQUE: Multidetector CT imaging of the head and cervical spine was performed following the standard protocol without intravenous contrast. Multiplanar CT image reconstructions of the cervical spine were also generated. RADIATION DOSE REDUCTION: This exam was performed according to the departmental dose-optimization program which includes automated exposure control, adjustment of the mA and/or kV according to patient size and/or use of iterative reconstruction technique. COMPARISON:  None Available. FINDINGS: CT HEAD FINDINGS Brain: No acute intracranial hemorrhage. No focal mass lesion. No CT evidence of acute infarction. No midline shift or mass effect. No hydrocephalus. Basilar cisterns are patent. There are periventricular and subcortical white matter hypodensities. Generalized cortical atrophy. Vascular: No hyperdense vessel or unexpected calcification. Skull: Normal. Negative for fracture or focal lesion. Sinuses/Orbits: Paranasal sinuses and mastoid air cells are clear. Orbits are clear. Other: None. CT CERVICAL SPINE FINDINGS Alignment: Normal alignment of the cervical vertebral bodies. Skull base and vertebrae: Normal craniocervical junction. No loss of vertebral body height or disc height. Normal facet articulation. No evidence of fracture. Soft tissues and spinal canal: No prevertebral soft tissue swelling. No perispinal or epidural hematoma. Disc levels: Anterior osteophytes from C5 to C7 with mild disc space narrowing. No acute findings. Upper chest: Clear Other: None IMPRESSION: 1. No acute intracranial findings. 2. Atrophy and white matter microvascular disease. 3. No cervical spine fracture. 4. Mild multilevel disc osteophytic disease. Electronically Signed   By: Genevive Bi M.D.   On: 04/17/2023 16:23   CT Cervical Spine Wo  Contrast Result Date: 04/17/2023 CLINICAL DATA:  Altered mental status.  Fall EXAM: CT HEAD WITHOUT CONTRAST CT CERVICAL SPINE WITHOUT CONTRAST TECHNIQUE: Multidetector CT imaging of the head and cervical spine was performed following the standard protocol without intravenous contrast. Multiplanar CT image reconstructions of the cervical spine were also generated. RADIATION DOSE REDUCTION: This exam was performed according to the departmental dose-optimization program which includes automated exposure control, adjustment of the mA and/or kV according to patient size and/or use of iterative reconstruction technique. COMPARISON:  None Available. FINDINGS: CT HEAD FINDINGS Brain: No acute intracranial hemorrhage. No focal mass lesion. No CT evidence of acute infarction. No midline shift or mass effect. No hydrocephalus. Basilar cisterns are patent. There are periventricular and subcortical white matter hypodensities. Generalized cortical atrophy. Vascular: No hyperdense vessel or unexpected calcification. Skull: Normal. Negative for fracture or focal lesion. Sinuses/Orbits: Paranasal sinuses and mastoid air cells are clear. Orbits are clear. Other: None. CT CERVICAL SPINE FINDINGS Alignment: Normal alignment of the cervical vertebral bodies. Skull base and vertebrae: Normal craniocervical junction. No loss of vertebral body height or disc height. Normal facet articulation. No evidence of fracture. Soft tissues and spinal canal: No prevertebral soft tissue swelling. No perispinal or epidural hematoma. Disc levels: Anterior osteophytes from C5 to C7 with mild disc space narrowing. No acute findings. Upper chest: Clear Other: None IMPRESSION: 1. No acute intracranial findings. 2. Atrophy and  white matter microvascular disease. 3. No cervical spine fracture. 4. Mild multilevel disc osteophytic disease. Electronically Signed   By: Genevive Bi M.D.   On: 04/17/2023 16:23   DG Chest Port 1 View Result Date:  04/17/2023 CLINICAL DATA:  Sepsis, altered mental status EXAM: PORTABLE CHEST 1 VIEW COMPARISON:  None Available. FINDINGS: Atherosclerotic calcification of the aortic arch. The overlap of the left posterior seventh rib and inferior scapular tip, an 8 mm density is present and a true intrapulmonary nodule is not excluded. Chest CT is recommended for further characterization. The lungs appear otherwise clear. Cardiac and mediastinal margins appear normal. IMPRESSION: 1. 8 mm density in the left lung base, possibly a true intrapulmonary nodule. Chest CT is recommended for further characterization. 2.  Aortic Atherosclerosis (ICD10-I70.0). Electronically Signed   By: Gaylyn Rong M.D.   On: 04/17/2023 15:47    Microbiology: Results for orders placed or performed during the hospital encounter of 04/17/23  Culture, blood (Routine x 2)     Status: None   Collection Time: 04/17/23  2:36 PM   Specimen: BLOOD  Result Value Ref Range Status   Specimen Description   Final    BLOOD RIGHT ANTECUBITAL Performed at [facility], [address], [place], [place] [address]    Special Requests   Final    BOTTLES DRAWN AEROBIC AND ANAEROBIC Blood Culture adequate volume Performed at [facility], [address] [place], [place] [address]    Culture   Final    NO GROWTH 5 DAYS Performed at [facility], [address], [place], [place] [address]    Report Status 04/22/2023 FINAL  Final  Culture, blood (Routine x 2)     Status: None   Collection Time: 04/17/23  2:40 PM   Specimen: BLOOD  Result Value Ref Range Status   Specimen Description BLOOD LAC  Final   Special Requests   Final    BOTTLES DRAWN AEROBIC AND ANAEROBIC Blood Culture adequate volume   Culture   Final    NO GROWTH 5 DAYS Performed at [facility], [address], [place], [place] [address]    Report Status 04/22/2023 FINAL  Final  Resp panel by RT-PCR (RSV, Flu A&B, Covid) Anterior Nasal Swab      Status: None   Collection Time: 04/17/23  6:08 PM   Specimen: Anterior Nasal Swab  Result Value Ref Range Status   SARS Coronavirus 2 by RT PCR NEGATIVE NEGATIVE Final    Comment: (NOTE) SARS-CoV-2 target nucleic acids are NOT DETECTED.  The SARS-CoV-2 RNA is generally detectable in upper respiratory specimens during the acute phase of infection. The lowest concentration of SARS-CoV-2 viral copies this assay can detect is 138 copies/mL. A negative result does not preclude SARS-Cov-2 infection and should not be used as the sole basis for treatment or other patient management decisions. A negative result may occur with  improper specimen collection/handling, submission of specimen other than nasopharyngeal swab, presence of viral mutation(s) within the areas targeted by this assay, and inadequate number of viral copies(<138 copies/mL). A negative result must be combined with clinical observations, patient history, and epidemiological information. The expected result is Negative.  Fact Sheet for Patients:  BloggerCourse.com  Fact Sheet for Healthcare Providers:  SeriousBroker.it  This test is no t yet approved or cleared by the [place] FDA and  has been authorized for detection and/or diagnosis of SARS-CoV-2 by FDA under an Emergency Use Authorization (EUA). This EUA  will remain  in effect (meaning this test can be used) for the duration of the COVID-19 declaration under Section 564(b)(1) of the Act, 21 U.S.C.section 360bbb-3(b)(1), unless the authorization is terminated  or revoked sooner.       Influenza A by PCR NEGATIVE NEGATIVE Final   Influenza B by PCR NEGATIVE NEGATIVE Final    Comment: (NOTE) The Xpert Xpress SARS-CoV-2/FLU/RSV plus assay is intended as an aid in the diagnosis of influenza from Nasopharyngeal swab specimens and should not be used as a sole basis for treatment. Nasal washings and aspirates are  unacceptable for Xpert Xpress SARS-CoV-2/FLU/RSV testing.  Fact Sheet for Patients: BloggerCourse.com  Fact Sheet for Healthcare Providers: SeriousBroker.it  This test is not yet approved or cleared by the [place] FDA and has been authorized for detection and/or diagnosis of SARS-CoV-2 by FDA under an Emergency Use Authorization (EUA). This EUA will remain in effect (meaning this test can be used) for the duration of the COVID-19 declaration under Section 564(b)(1) of the Act, 21 U.S.C. section 360bbb-3(b)(1), unless the authorization is terminated or revoked.     Resp Syncytial Virus by PCR NEGATIVE NEGATIVE Final    Comment: (NOTE) Fact Sheet for Patients: BloggerCourse.com  Fact Sheet for Healthcare Providers: SeriousBroker.it  This test is not yet approved or cleared by the [place] FDA and has been authorized for detection and/or diagnosis of SARS-CoV-2 by FDA under an Emergency Use Authorization (EUA). This EUA will remain in effect (meaning this test can be used) for the duration of the COVID-19 declaration under Section 564(b)(1) of the Act, 21 U.S.C. section 360bbb-3(b)(1), unless the authorization is terminated or revoked.  Performed at [facility], [address] [place], [place] [address]     Labs: CBC: Recent Labs  Lab 04/17/23 1431 04/18/23 0312 04/19/23 0400 04/20/23 0529 04/21/23 0523 04/22/23 0508  WBC 16.6* 9.1 9.5 15.3* 12.2* 7.4  NEUTROABS 13.8*  --   --  13.1* 8.5* 4.3  HGB 14.6 13.4 13.9 14.4 13.0 12.6*  HCT 41.4 38.9* 39.4 40.5 37.5* 36.4*  MCV 85.2 84.7 83.7 83.9 84.8 87.1  PLT 266 242 254 311 281 244   Basic Metabolic Panel: Recent Labs  Lab 04/18/23 0312 04/19/23 0400 04/20/23 0529 04/21/23 0523 04/22/23 0508  NA 137 138 137 137 136  K 3.5 4.3 4.3 4.2 3.8  CL 106 106 105 106 106  CO2 24 20* 24 22 21*   GLUCOSE 106* 121* 152* 103* 108*  BUN 26* 24* 22 30* 33*  CREATININE 1.41* 1.44* 1.67* 2.18* 1.71*  CALCIUM 9.4 9.3 9.4 8.8* 8.8*  MG  --  2.4  --   --   --    Liver Function Tests: Recent Labs  Lab 04/17/23 1431  AST 27  ALT 21  ALKPHOS 85  BILITOT 0.9  PROT 8.2*  ALBUMIN 4.8   CBG: Recent Labs  Lab 04/22/23 0803  GLUCAP 96    Discharge time spent:  37 minutes.  Signed: Loyce Dys, MD [facility] 04/22/2023   Addendum: Tamsulosin 0.4 mg daily added to patient's medications

## 2023-04-22 NOTE — TOC Progression Note (Signed)
Transition of Care [facility]) - Progression Note    Patient Details  Name: Adrian Aguilar MRN: [id number] Date of Birth: 01/03/1933  [facility] CM/SW Contact  Chapman Fitch, RN Phone Number: 04/22/2023, 10:34 AM  Clinical Narrative:    Insurance Berkley Harvey has been obtained and [facility] can admit today.  MD aware   Expected Discharge Plan: Skilled Nursing Facility Barriers to Discharge: Continued Medical Work up  Expected Discharge Plan and Services     Post Acute Care Choice: Skilled Nursing Facility Living arrangements for the past 2 months: Single Family Home                                       Social Determinants of Health (SDOH) Interventions SDOH Screenings   Food Insecurity: No Food Insecurity (04/20/2023)  Housing: Low Risk  (04/20/2023)  Transportation Needs: No Transportation Needs (04/20/2023)  Utilities: Not At Risk (04/20/2023)  Financial Resource Strain: Low Risk  (03/08/2022)   Received from [facility]  Physical Activity: Unknown (03/08/2022)   Received from [facility]  Social Connections: Moderately Isolated (04/20/2023)  Stress: No Stress Concern Present (03/08/2022)   Received from [facility]  Tobacco Use: Low Risk  (04/17/2023)    Readmission Risk Interventions     No data to display

## 2023-04-22 NOTE — Plan of Care (Signed)

## 2023-04-22 NOTE — Progress Notes (Signed)
Attempted to call [facility] [phone] room 12A  Called 2 times placed on hold for more than 5 min. Unable properly give report. Message left with secretary for call back

## 2023-05-01 ENCOUNTER — Inpatient Hospital Stay
Admission: EM | Admit: 2023-05-01 | Discharge: 2023-05-04 | DRG: 871 | Disposition: A | Discharge status: Home Health | Payer: Medicare Other | Attending: Internal Medicine | Admitting: Internal Medicine

## 2023-05-01 ENCOUNTER — Emergency Department: Payer: Medicare Other

## 2023-05-01 ENCOUNTER — Other Ambulatory Visit: Payer: Self-pay

## 2023-05-01 DIAGNOSIS — N1832 Chronic kidney disease, stage 3b: Secondary | ICD-10-CM | POA: Diagnosis present

## 2023-05-01 DIAGNOSIS — Z888 Allergy status to other drugs, medicaments and biological substances status: Secondary | ICD-10-CM

## 2023-05-01 DIAGNOSIS — I739 Peripheral vascular disease, unspecified: Secondary | ICD-10-CM | POA: Diagnosis present

## 2023-05-01 DIAGNOSIS — E872 Acidosis, unspecified: Secondary | ICD-10-CM | POA: Diagnosis not present

## 2023-05-01 DIAGNOSIS — Z79899 Other long term (current) drug therapy: Secondary | ICD-10-CM

## 2023-05-01 DIAGNOSIS — Z7401 Bed confinement status: Secondary | ICD-10-CM

## 2023-05-01 DIAGNOSIS — K810 Acute cholecystitis: Secondary | ICD-10-CM | POA: Diagnosis present

## 2023-05-01 DIAGNOSIS — I10 Essential (primary) hypertension: Secondary | ICD-10-CM | POA: Diagnosis present

## 2023-05-01 DIAGNOSIS — M109 Gout, unspecified: Secondary | ICD-10-CM | POA: Insufficient documentation

## 2023-05-01 DIAGNOSIS — E871 Hypo-osmolality and hyponatremia: Secondary | ICD-10-CM | POA: Insufficient documentation

## 2023-05-01 DIAGNOSIS — Z9842 Cataract extraction status, left eye: Secondary | ICD-10-CM

## 2023-05-01 DIAGNOSIS — R4182 Altered mental status, unspecified: Secondary | ICD-10-CM | POA: Diagnosis not present

## 2023-05-01 DIAGNOSIS — I714 Abdominal aortic aneurysm, without rupture, unspecified: Secondary | ICD-10-CM | POA: Diagnosis present

## 2023-05-01 DIAGNOSIS — K819 Cholecystitis, unspecified: Secondary | ICD-10-CM | POA: Diagnosis present

## 2023-05-01 DIAGNOSIS — G9341 Metabolic encephalopathy: Secondary | ICD-10-CM | POA: Diagnosis present

## 2023-05-01 DIAGNOSIS — I160 Hypertensive urgency: Secondary | ICD-10-CM

## 2023-05-01 DIAGNOSIS — R338 Other retention of urine: Secondary | ICD-10-CM | POA: Diagnosis present

## 2023-05-01 DIAGNOSIS — Z833 Family history of diabetes mellitus: Secondary | ICD-10-CM

## 2023-05-01 DIAGNOSIS — Z884 Allergy status to anesthetic agent status: Secondary | ICD-10-CM

## 2023-05-01 DIAGNOSIS — K5900 Constipation, unspecified: Secondary | ICD-10-CM | POA: Diagnosis present

## 2023-05-01 DIAGNOSIS — Z9841 Cataract extraction status, right eye: Secondary | ICD-10-CM

## 2023-05-01 DIAGNOSIS — A419 Sepsis, unspecified organism: Secondary | ICD-10-CM | POA: Diagnosis not present

## 2023-05-01 DIAGNOSIS — Z7982 Long term (current) use of aspirin: Secondary | ICD-10-CM

## 2023-05-01 DIAGNOSIS — E876 Hypokalemia: Secondary | ICD-10-CM | POA: Diagnosis present

## 2023-05-01 DIAGNOSIS — Z1152 Encounter for screening for COVID-19: Secondary | ICD-10-CM

## 2023-05-01 DIAGNOSIS — K828 Other specified diseases of gallbladder: Secondary | ICD-10-CM | POA: Diagnosis present

## 2023-05-01 DIAGNOSIS — Z961 Presence of intraocular lens: Secondary | ICD-10-CM | POA: Diagnosis present

## 2023-05-01 DIAGNOSIS — F0390 Unspecified dementia without behavioral disturbance: Secondary | ICD-10-CM | POA: Diagnosis present

## 2023-05-01 DIAGNOSIS — Z91013 Allergy to seafood: Secondary | ICD-10-CM

## 2023-05-01 DIAGNOSIS — F03B0 Unspecified dementia, moderate, without behavioral disturbance, psychotic disturbance, mood disturbance, and anxiety: Secondary | ICD-10-CM | POA: Diagnosis present

## 2023-05-01 DIAGNOSIS — N401 Enlarged prostate with lower urinary tract symptoms: Secondary | ICD-10-CM | POA: Diagnosis present

## 2023-05-01 DIAGNOSIS — I129 Hypertensive chronic kidney disease with stage 1 through stage 4 chronic kidney disease, or unspecified chronic kidney disease: Secondary | ICD-10-CM | POA: Diagnosis present

## 2023-05-01 DIAGNOSIS — N400 Enlarged prostate without lower urinary tract symptoms: Secondary | ICD-10-CM | POA: Insufficient documentation

## 2023-05-01 LAB — URINALYSIS, W/ REFLEX TO CULTURE (INFECTION SUSPECTED)
Bacteria, UA: NONE SEEN
Bilirubin Urine: NEGATIVE
Glucose, UA: NEGATIVE mg/dL
Ketones, ur: NEGATIVE mg/dL
Leukocytes,Ua: NEGATIVE
Nitrite: NEGATIVE
Protein, ur: 30 mg/dL — AB
RBC / HPF: >50 RBC/hpf (ref 0–5)
Specific Gravity, Urine: 1.021 (ref 1.005–1.030)
pH: 5 (ref 5.0–8.0)

## 2023-05-01 LAB — CBC WITH DIFFERENTIAL/PLATELET
Abs Immature Granulocytes: 0.28 10*3/uL — ABNORMAL HIGH (ref 0.00–0.07)
Basophils Absolute: 0.1 10*3/uL (ref 0.0–0.1)
Basophils Relative: 0 %
Eosinophils Absolute: 0 10*3/uL (ref 0.0–0.5)
Eosinophils Relative: 0 %
HCT: 44.2 % (ref 39.0–52.0)
Hemoglobin: 15.5 g/dL (ref 13.0–17.0)
Immature Granulocytes: 1 %
Lymphocytes Relative: 3 %
Lymphs Abs: 1.1 10*3/uL (ref 0.7–4.0)
MCH: 29.9 pg (ref 26.0–34.0)
MCHC: 35.1 g/dL (ref 30.0–36.0)
MCV: 85.3 fL (ref 80.0–100.0)
Monocytes Absolute: 2.9 10*3/uL — ABNORMAL HIGH (ref 0.1–1.0)
Monocytes Relative: 9 %
Neutro Abs: 27.7 10*3/uL — ABNORMAL HIGH (ref 1.7–7.7)
Neutrophils Relative %: 87 %
Platelets: 419 10*3/uL — ABNORMAL HIGH (ref 150–400)
RBC: 5.18 MIL/uL (ref 4.22–5.81)
RDW: 12.4 % (ref 11.5–15.5)
Smear Review: NORMAL
WBC: 32 10*3/uL — ABNORMAL HIGH (ref 4.0–10.5)
nRBC: 0 % (ref 0.0–0.2)

## 2023-05-01 LAB — COMPREHENSIVE METABOLIC PANEL
ALT: 18 U/L (ref 0–44)
AST: 21 U/L (ref 15–41)
Albumin: 4.1 g/dL (ref 3.5–5.0)
Alkaline Phosphatase: 71 U/L (ref 38–126)
Anion gap: 16 — ABNORMAL HIGH (ref 5–15)
BUN: 26 mg/dL — ABNORMAL HIGH (ref 8–23)
CO2: 20 mmol/L — ABNORMAL LOW (ref 22–32)
Calcium: 9.7 mg/dL (ref 8.9–10.3)
Chloride: 95 mmol/L — ABNORMAL LOW (ref 98–111)
Creatinine, Ser: 1.36 mg/dL — ABNORMAL HIGH (ref 0.61–1.24)
GFR, Estimated: 50 mL/min — ABNORMAL LOW (ref >60)
Glucose, Bld: 173 mg/dL — ABNORMAL HIGH (ref 70–99)
Potassium: 4.8 mmol/L (ref 3.5–5.1)
Sodium: 131 mmol/L — ABNORMAL LOW (ref 135–145)
Total Bilirubin: 1.3 mg/dL — ABNORMAL HIGH (ref 0.0–1.2)
Total Protein: 8.1 g/dL (ref 6.5–8.1)

## 2023-05-01 LAB — RESP PANEL BY RT-PCR (RSV, FLU A&B, COVID)  RVPGX2
Influenza A by PCR: NEGATIVE
Influenza B by PCR: NEGATIVE
Resp Syncytial Virus by PCR: NEGATIVE
SARS Coronavirus 2 by RT PCR: NEGATIVE

## 2023-05-01 LAB — PROTIME-INR
INR: 1.1 (ref 0.8–1.2)
Prothrombin Time: 14.4 s (ref 11.4–15.2)

## 2023-05-01 LAB — LACTIC ACID, PLASMA: Lactic Acid, Venous: 1.9 mmol/L (ref 0.5–1.9)

## 2023-05-01 LAB — APTT: aPTT: 22 s — ABNORMAL LOW (ref 24–36)

## 2023-05-01 MED ORDER — IOHEXOL 350 MG/ML SOLN
100.0000 mL | Freq: Once | INTRAVENOUS | Status: AC | PRN
  Administered 2023-05-01: 100 mL via INTRAVENOUS

## 2023-05-01 MED ORDER — ONDANSETRON HCL 4 MG/2ML IJ SOLN
4.0000 mg | Freq: Once | INTRAMUSCULAR | Status: AC
  Administered 2023-05-01: 4 mg via INTRAVENOUS
  Filled 2023-05-01: qty 2

## 2023-05-01 MED ORDER — METRONIDAZOLE 500 MG/100ML IV SOLN
500.0000 mg | Freq: Once | INTRAVENOUS | Status: AC
  Administered 2023-05-02: 500 mg via INTRAVENOUS
  Filled 2023-05-01: qty 100

## 2023-05-01 MED ORDER — HYDROMORPHONE HCL 1 MG/ML IJ SOLN
0.5000 mg | Freq: Once | INTRAMUSCULAR | Status: AC
  Administered 2023-05-01: 0.5 mg via INTRAVENOUS
  Filled 2023-05-01: qty 0.5

## 2023-05-01 MED ORDER — SODIUM CHLORIDE 0.9 % IV SOLN
2.0000 g | Freq: Once | INTRAVENOUS | Status: AC
  Administered 2023-05-02: 2 g via INTRAVENOUS
  Filled 2023-05-01: qty 12.5

## 2023-05-01 MED ORDER — VANCOMYCIN HCL IN DEXTROSE 1-5 GM/200ML-% IV SOLN
1000.0000 mg | Freq: Once | INTRAVENOUS | Status: AC
  Administered 2023-05-02: 1000 mg via INTRAVENOUS
  Filled 2023-05-01: qty 200

## 2023-05-01 NOTE — ED Provider Notes (Signed)
----------------------------------------- 11:41 PM on 05/01/2023 -----------------------------------------  Assuming care from Dr. Fuller Plan.  In short, Adrian Aguilar is a 89 y.o. male with a chief complaint of sepsis and abdominal pain.  Refer to the original H&P for additional details.  The current plan of care is to follow up on CT and reassess.   Clinical Course as of 05/02/23 0146  Wynelle Link May 02, 2023  0011 CT Angio Abd/Pel W and/or Wo Contrast I viewed and interpreted the patient's CTA abdomen/pelvis.  No acute findings suggestive of AAS or SBO.  However, he has some inflammatory changes around the gallbladder.  Radiologist confirmed concern for possible cholecystitis and recommended ultrasound for more evaluation.  This would correlate clinically.  Dr. Fuller Plan is updating family and I ordered a right upper quadrant ultrasound to try and better evaluate the gallbladder.  Anticipate admission to the hospital either for surgery for cholecystitis or to the hospitalist service for medical management [CF]  0133 US ABDOMEN LIMITED RUQ (LIVER/GB) I viewed and interpreted the patient's ultrasound and there are inflammatory changes around the gallbladder.  Radiologist confirms acute cholecystitis. [CF]  0134 I reassessed the patient.  He is sleeping but moans loudly and guards to palpation of the abdomen, all throughout the abdomen but particularly in the right upper quadrant.  His wife confirmed he is full code and wants all measures necessary to be taken.  Sepsis reassessment complete.  Hemodynamically stable [CF]  0145 Consulted with Dr. Maurine Minister by phone.  We discussed the case and he agrees to see the patient in the morning for probable surgery.  He recommended switching to Zosyn for morning antibiotics.  He requested that I consult the hospitalist service for admission if they are willing to do so.  I am consulting the hospitalist team for admission and discussed the case in person with Dr. Arville Care with  the hospitalist service who will admit.  I ordered 2.5 L of lactated ringer solution to meet sepsis goals of 30 mL/kg and to fluid resuscitate the patient anticipating surgery. [CF]    Clinical Course User Index [CF] Loleta Rose, MD   .Critical Care  Performed by: Loleta Rose, MD Authorized by: Loleta Rose, MD   Critical care provider statement:    Critical care time (minutes):  30   Critical care time was exclusive of:  Separately billable procedures and treating other patients   Critical care was necessary to treat or prevent imminent or life-threatening deterioration of the following conditions:  Sepsis   Critical care was time spent personally by me on the following activities:  Development of treatment plan with patient or surrogate, evaluation of patient's response to treatment, examination of patient, obtaining history from patient or surrogate, ordering and performing treatments and interventions, ordering and review of laboratory studies, ordering and review of radiographic studies, pulse oximetry, re-evaluation of patient's condition and review of old charts .1-3 Lead EKG Interpretation  Performed by: Loleta Rose, MD Authorized by: Loleta Rose, MD     Interpretation: normal     ECG rate:  95   ECG rate assessment: normal     Rhythm: sinus rhythm     Ectopy: none     Conduction: normal      Medications  metroNIDAZOLE (FLAGYL) IVPB 500 mg (has no administration in time range)  lactated ringers bolus 1,000 mL (has no administration in time range)    And  lactated ringers bolus 1,000 mL (has no administration in time range)    And  lactated ringers  bolus 500 mL (has no administration in time range)  HYDROmorphone (DILAUDID) injection 0.5 mg (0.5 mg Intravenous Given 05/01/23 2311)  ondansetron (ZOFRAN) injection 4 mg (4 mg Intravenous Given 05/01/23 2310)  iohexol (OMNIPAQUE) 350 MG/ML injection 100 mL (100 mLs Intravenous Contrast Given 05/01/23 2332)  ceFEPIme  (MAXIPIME) 2 g in sodium chloride 0.9 % 100 mL IVPB (2 g Intravenous New Bag/Given 05/02/23 0004)  vancomycin (VANCOCIN) IVPB 1000 mg/200 mL premix (1,000 mg Intravenous New Bag/Given 05/02/23 0025)     ED Discharge Orders     None      Final diagnoses:  Sepsis, due to unspecified organism, unspecified whether acute organ dysfunction present (HCC)  Cholecystitis  Altered mental status, unspecified altered mental status type     Loleta Rose, MD 05/02/23 [phone]

## 2023-05-01 NOTE — Progress Notes (Signed)
CODE SEPSIS - PHARMACY COMMUNICATION  **Broad Spectrum Antibiotics should be administered within 1 hour of Sepsis diagnosis**  Time Code Sepsis Called/Page Received: 2323  Antibiotics Ordered: Cefepime, Flagyl, Vancomycin  Time of 1st antibiotic administration: 0004  Otelia Sergeant, PharmD, [facility] 05/01/2023 11:24 PM

## 2023-05-01 NOTE — ED Provider Notes (Addendum)
[facility] Provider Note    Event Date/Time   First MD Initiated Contact with Patient 05/01/23 2110     (approximate)   History   No chief complaint on file.   HPI  Adrian Aguilar is a 89 y.o. male with CKD, peripheral arterial disease status post aortic bifurcation, hypertension who came in with concern for altered mental status.  I reviewed with patient was admitted from 1/11 until 1/16.  Patient was found to have severe constipation and urinary retention so Foley catheter was placed.  Patient reports some abdominal pain.  He denies any other concerns.  Patient seems pleasantly confused.  Physical Exam   Triage Vital Signs: ED Triage Vitals  Encounter Vitals Group     BP      Systolic BP Percentile      Diastolic BP Percentile      Pulse      Resp      Temp      Temp src      SpO2      Weight      Height      Head Circumference      Peak Flow      Pain Score      Pain Loc      Pain Education      Exclude from Growth Chart     Most recent vital signs: Vitals:   05/01/23 2154 05/01/23 2200  BP:  (!) 201/74  Pulse:  89  Resp:  (!) 23  Temp: 97.8 F (36.6 C)   SpO2:  100%     General: Awake, no distress.  Patient appears confused. CV:  Good peripheral perfusion.  Resp:  Normal effort.  Abd:  Tender in the lower abdomen.  Abdomen without any significant distention.  Foley catheter in place.   Other:  Patient has a little bit of redness noted near his right testicle I do not see any Fournier's gangrene or black discoloration.   ED Results / Procedures / Treatments   Labs (all labs ordered are listed, but only abnormal results are displayed) Labs Reviewed  COMPREHENSIVE METABOLIC PANEL - Abnormal; Notable for the following components:      Result Value   Sodium 131 (*)    Chloride 95 (*)    CO2 20 (*)    Glucose, Bld 173 (*)    BUN 26 (*)    Creatinine, Ser 1.36 (*)    Total Bilirubin 1.3 (*)    GFR, Estimated 50 (*)     Anion gap 16 (*)    All other components within normal limits  CBC WITH DIFFERENTIAL/PLATELET - Abnormal; Notable for the following components:   WBC 32.0 (*)    Platelets 419 (*)    Neutro Abs 27.7 (*)    Monocytes Absolute 2.9 (*)    Abs Immature Granulocytes 0.28 (*)    All other components within normal limits  APTT - Abnormal; Notable for the following components:   aPTT 22 (*)    All other components within normal limits  URINALYSIS, W/ REFLEX TO CULTURE (INFECTION SUSPECTED) - Abnormal; Notable for the following components:   Color, Urine YELLOW (*)    APPearance HAZY (*)    Hgb urine dipstick SMALL (*)    Protein, ur 30 (*)    All other components within normal limits  RESP PANEL BY RT-PCR (RSV, FLU A&B, COVID)  RVPGX2  CULTURE, BLOOD (ROUTINE X 2)  CULTURE, BLOOD (ROUTINE X  2)  LACTIC ACID, PLASMA  PROTIME-INR  LACTIC ACID, PLASMA     EKG  My interpretation of EKG:  Sinus rate of 89 without any ST elevation, T wave versions, normal intervals  RADIOLOGY I have reviewed the xray personally and interpreted and no focal pneumonia  PROCEDURES:  Critical Care performed: Yes, see critical care procedure note(s)  .1-3 Lead EKG Interpretation  Performed by: Concha Se, MD Authorized by: Concha Se, MD     Interpretation: normal     ECG rate:  80   ECG rate assessment: normal     Rhythm: sinus rhythm     Ectopy: none     Conduction: normal   .Critical Care  Performed by: Concha Se, MD Authorized by: Concha Se, MD   Critical care provider statement:    Critical care time (minutes):  30   Critical care was necessary to treat or prevent imminent or life-threatening deterioration of the following conditions:  Sepsis   Critical care was time spent personally by me on the following activities:  Development of treatment plan with patient or surrogate, discussions with consultants, evaluation of patient's response to treatment, examination of patient,  ordering and review of laboratory studies, ordering and review of radiographic studies, ordering and performing treatments and interventions, pulse oximetry, re-evaluation of patient's condition and review of old charts    MEDICATIONS ORDERED IN ED: Medications  iohexol (OMNIPAQUE) 350 MG/ML injection 100 mL (has no administration in time range)  HYDROmorphone (DILAUDID) injection 0.5 mg (0.5 mg Intravenous Given 05/01/23 2311)  ondansetron (ZOFRAN) injection 4 mg (4 mg Intravenous Given 05/01/23 2310)     IMPRESSION / MDM / ASSESSMENT AND PLAN / ED COURSE  I reviewed the triage vital signs and the nursing notes.   Patient's presentation is most consistent with acute presentation with potential threat to life or bodily function.   Patient comes in with some confusion with concern for some abdominal pain.  Differential includes UTI, intracranial hemorrhage, acute abdominal process such as perforation, obstruction, aneurysm rupture, kidney stones.  Patient given some IV Dilaudid IV Zofran to help with symptoms.  UA with RBCs in it but no evidence of UTI.  COVID, flu were negative.  Lactate normal.  Sodium slightly low white count significantly elevated at 32,000.  Therefore patient meets sepsis criteria will start broad-spectrum antibiotics.  Patient we had off to oncoming team pending CT imaging but suspect admission for sepsis rule out  The patient is on the cardiac monitor to evaluate for evidence of arrhythmia and/or significant heart rate changes.      FINAL CLINICAL IMPRESSION(S) / ED DIAGNOSES   Final diagnoses:  Sepsis, due to unspecified organism, unspecified whether acute organ dysfunction present [facility])     Rx / DC Orders   ED Discharge Orders     None        Note:  This document was prepared using Dragon voice recognition software and may include unintentional dictation errors.   Concha Se, MD 05/01/23 9604    Concha Se, MD 05/01/23 [phone]

## 2023-05-01 NOTE — Progress Notes (Signed)
Pt being followed by [facility] for Sepsis protocol.

## 2023-05-02 ENCOUNTER — Inpatient Hospital Stay: Payer: Medicare Other | Admitting: Radiology

## 2023-05-02 ENCOUNTER — Emergency Department: Payer: Medicare Other

## 2023-05-02 ENCOUNTER — Encounter: Payer: Self-pay | Admitting: Family Medicine

## 2023-05-02 DIAGNOSIS — I160 Hypertensive urgency: Secondary | ICD-10-CM | POA: Diagnosis present

## 2023-05-02 DIAGNOSIS — A419 Sepsis, unspecified organism: Secondary | ICD-10-CM

## 2023-05-02 DIAGNOSIS — Z833 Family history of diabetes mellitus: Secondary | ICD-10-CM | POA: Diagnosis not present

## 2023-05-02 DIAGNOSIS — E876 Hypokalemia: Secondary | ICD-10-CM | POA: Diagnosis present

## 2023-05-02 DIAGNOSIS — G9341 Metabolic encephalopathy: Secondary | ICD-10-CM | POA: Diagnosis present

## 2023-05-02 DIAGNOSIS — K819 Cholecystitis, unspecified: Secondary | ICD-10-CM | POA: Diagnosis present

## 2023-05-02 DIAGNOSIS — I129 Hypertensive chronic kidney disease with stage 1 through stage 4 chronic kidney disease, or unspecified chronic kidney disease: Secondary | ICD-10-CM | POA: Diagnosis present

## 2023-05-02 DIAGNOSIS — Z9841 Cataract extraction status, right eye: Secondary | ICD-10-CM | POA: Diagnosis not present

## 2023-05-02 DIAGNOSIS — K5900 Constipation, unspecified: Secondary | ICD-10-CM | POA: Diagnosis present

## 2023-05-02 DIAGNOSIS — N400 Enlarged prostate without lower urinary tract symptoms: Secondary | ICD-10-CM | POA: Insufficient documentation

## 2023-05-02 DIAGNOSIS — Z91013 Allergy to seafood: Secondary | ICD-10-CM | POA: Diagnosis not present

## 2023-05-02 DIAGNOSIS — R4182 Altered mental status, unspecified: Secondary | ICD-10-CM | POA: Diagnosis present

## 2023-05-02 DIAGNOSIS — E871 Hypo-osmolality and hyponatremia: Secondary | ICD-10-CM | POA: Insufficient documentation

## 2023-05-02 DIAGNOSIS — N401 Enlarged prostate with lower urinary tract symptoms: Secondary | ICD-10-CM | POA: Diagnosis present

## 2023-05-02 DIAGNOSIS — Z888 Allergy status to other drugs, medicaments and biological substances status: Secondary | ICD-10-CM | POA: Diagnosis not present

## 2023-05-02 DIAGNOSIS — R338 Other retention of urine: Secondary | ICD-10-CM | POA: Diagnosis present

## 2023-05-02 DIAGNOSIS — Z1152 Encounter for screening for COVID-19: Secondary | ICD-10-CM | POA: Diagnosis not present

## 2023-05-02 DIAGNOSIS — F0390 Unspecified dementia without behavioral disturbance: Secondary | ICD-10-CM | POA: Diagnosis present

## 2023-05-02 DIAGNOSIS — Z9842 Cataract extraction status, left eye: Secondary | ICD-10-CM | POA: Diagnosis not present

## 2023-05-02 DIAGNOSIS — Z7982 Long term (current) use of aspirin: Secondary | ICD-10-CM | POA: Diagnosis not present

## 2023-05-02 DIAGNOSIS — K810 Acute cholecystitis: Secondary | ICD-10-CM

## 2023-05-02 DIAGNOSIS — Z961 Presence of intraocular lens: Secondary | ICD-10-CM | POA: Diagnosis present

## 2023-05-02 DIAGNOSIS — E872 Acidosis, unspecified: Secondary | ICD-10-CM | POA: Diagnosis not present

## 2023-05-02 DIAGNOSIS — M109 Gout, unspecified: Secondary | ICD-10-CM | POA: Insufficient documentation

## 2023-05-02 DIAGNOSIS — I739 Peripheral vascular disease, unspecified: Secondary | ICD-10-CM | POA: Diagnosis present

## 2023-05-02 DIAGNOSIS — Z7401 Bed confinement status: Secondary | ICD-10-CM | POA: Diagnosis not present

## 2023-05-02 DIAGNOSIS — N1832 Chronic kidney disease, stage 3b: Secondary | ICD-10-CM | POA: Diagnosis present

## 2023-05-02 HISTORY — PX: IR PERC CHOLECYSTOSTOMY: IMG2326

## 2023-05-02 LAB — COMPREHENSIVE METABOLIC PANEL
ALT: 15 U/L (ref 0–44)
AST: 18 U/L (ref 15–41)
Albumin: 3.5 g/dL (ref 3.5–5.0)
Alkaline Phosphatase: 62 U/L (ref 38–126)
Anion gap: 10 (ref 5–15)
BUN: 24 mg/dL — ABNORMAL HIGH (ref 8–23)
CO2: 22 mmol/L (ref 22–32)
Calcium: 9 mg/dL (ref 8.9–10.3)
Chloride: 97 mmol/L — ABNORMAL LOW (ref 98–111)
Creatinine, Ser: 1.29 mg/dL — ABNORMAL HIGH (ref 0.61–1.24)
GFR, Estimated: 53 mL/min — ABNORMAL LOW (ref >60)
Glucose, Bld: 155 mg/dL — ABNORMAL HIGH (ref 70–99)
Potassium: 4.5 mmol/L (ref 3.5–5.1)
Sodium: 129 mmol/L — ABNORMAL LOW (ref 135–145)
Total Bilirubin: 1.1 mg/dL (ref 0.0–1.2)
Total Protein: 6.8 g/dL (ref 6.5–8.1)

## 2023-05-02 LAB — CORTISOL-AM, BLOOD: Cortisol - AM: 28.6 ug/dL — ABNORMAL HIGH (ref 6.7–22.6)

## 2023-05-02 LAB — CBC
HCT: 39.1 % (ref 39.0–52.0)
Hemoglobin: 13.8 g/dL (ref 13.0–17.0)
MCH: 29.8 pg (ref 26.0–34.0)
MCHC: 35.3 g/dL (ref 30.0–36.0)
MCV: 84.4 fL (ref 80.0–100.0)
Platelets: 370 10*3/uL (ref 150–400)
RBC: 4.63 MIL/uL (ref 4.22–5.81)
RDW: 12.3 % (ref 11.5–15.5)
WBC: 31.7 10*3/uL — ABNORMAL HIGH (ref 4.0–10.5)
nRBC: 0 % (ref 0.0–0.2)

## 2023-05-02 LAB — PROTIME-INR
INR: 1.3 — ABNORMAL HIGH (ref 0.8–1.2)
Prothrombin Time: 16.1 s — ABNORMAL HIGH (ref 11.4–15.2)

## 2023-05-02 MED ORDER — ENOXAPARIN SODIUM 40 MG/0.4ML IJ SOSY
40.0000 mg | PREFILLED_SYRINGE | INTRAMUSCULAR | Status: DC
  Administered 2023-05-02 – 2023-05-04 (×3): 40 mg via SUBCUTANEOUS
  Filled 2023-05-02 (×3): qty 0.4

## 2023-05-02 MED ORDER — IOHEXOL 300 MG/ML  SOLN
10.0000 mL | Freq: Once | INTRAMUSCULAR | Status: AC | PRN
  Administered 2023-05-02: 10 mL

## 2023-05-02 MED ORDER — FENTANYL CITRATE (PF) 100 MCG/2ML IJ SOLN
INTRAMUSCULAR | Status: AC
  Filled 2023-05-02: qty 2

## 2023-05-02 MED ORDER — ACETAMINOPHEN 325 MG PO TABS: 650.0000 mg | ORAL_TABLET | Freq: Four times a day (QID) | ORAL | Status: DC | PRN

## 2023-05-02 MED ORDER — LISINOPRIL 20 MG PO TABS
20.0000 mg | ORAL_TABLET | Freq: Every day | ORAL | Status: DC
  Administered 2023-05-02 – 2023-05-04 (×3): 20 mg via ORAL
  Filled 2023-05-02: qty 1
  Filled 2023-05-02: qty 2
  Filled 2023-05-02: qty 1

## 2023-05-02 MED ORDER — SODIUM CHLORIDE 0.9 % IV SOLN: INTRAVENOUS | Status: AC

## 2023-05-02 MED ORDER — LIDOCAINE 5 % EX PTCH
1.0000 | MEDICATED_PATCH | CUTANEOUS | Status: DC
  Administered 2023-05-03: 1 via TRANSDERMAL
  Filled 2023-05-02 (×2): qty 1

## 2023-05-02 MED ORDER — HYDRALAZINE HCL 20 MG/ML IJ SOLN
10.0000 mg | Freq: Four times a day (QID) | INTRAMUSCULAR | Status: DC | PRN
  Administered 2023-05-02: 10 mg via INTRAVENOUS
  Filled 2023-05-02: qty 1

## 2023-05-02 MED ORDER — CHLORHEXIDINE GLUCONATE CLOTH 2 % EX PADS
6.0000 | MEDICATED_PAD | Freq: Every day | CUTANEOUS | Status: DC
Start: 2023-05-03 — End: 2023-05-04
  Administered 2023-05-03: 6 via TOPICAL

## 2023-05-02 MED ORDER — FEBUXOSTAT 40 MG PO TABS
40.0000 mg | ORAL_TABLET | Freq: Every day | ORAL | Status: DC
  Administered 2023-05-02 – 2023-05-04 (×3): 40 mg via ORAL
  Filled 2023-05-02 (×3): qty 1

## 2023-05-02 MED ORDER — LABETALOL HCL 5 MG/ML IV SOLN: 20.0000 mg | INTRAVENOUS | Status: DC | PRN

## 2023-05-02 MED ORDER — VITAMIN C 500 MG PO TABS
1000.0000 mg | ORAL_TABLET | Freq: Every day | ORAL | Status: DC
Start: 2023-05-02 — End: 2023-05-04
  Administered 2023-05-02 – 2023-05-04 (×3): 1000 mg via ORAL
  Filled 2023-05-02 (×3): qty 2

## 2023-05-02 MED ORDER — OXYCODONE-ACETAMINOPHEN 7.5-325 MG PO TABS: 1.0000 | ORAL_TABLET | ORAL | Status: DC | PRN

## 2023-05-02 MED ORDER — ONDANSETRON HCL 4 MG/2ML IJ SOLN: 4.0000 mg | Freq: Four times a day (QID) | INTRAMUSCULAR | Status: DC | PRN

## 2023-05-02 MED ORDER — MIDAZOLAM HCL 2 MG/2ML IJ SOLN
INTRAMUSCULAR | Status: AC | PRN
  Administered 2023-05-02: 1 mg via INTRAVENOUS

## 2023-05-02 MED ORDER — MIDAZOLAM HCL 2 MG/2ML IJ SOLN
INTRAMUSCULAR | Status: AC
Start: 2023-05-02 — End: ?
  Filled 2023-05-02: qty 2

## 2023-05-02 MED ORDER — LIDOCAINE HCL 1 % IJ SOLN
INTRAMUSCULAR | Status: AC
  Filled 2023-05-02: qty 20

## 2023-05-02 MED ORDER — PIPERACILLIN-TAZOBACTAM 3.375 G IVPB 30 MIN: 3.3750 g | Freq: Four times a day (QID) | INTRAVENOUS | Status: DC

## 2023-05-02 MED ORDER — ACETAMINOPHEN 650 MG RE SUPP: 650.0000 mg | Freq: Four times a day (QID) | RECTAL | Status: DC | PRN

## 2023-05-02 MED ORDER — LACTATED RINGERS IV BOLUS (SEPSIS)
1000.0000 mL | Freq: Once | INTRAVENOUS | Status: AC
  Administered 2023-05-02: 1000 mL via INTRAVENOUS

## 2023-05-02 MED ORDER — TAMSULOSIN HCL 0.4 MG PO CAPS
0.4000 mg | ORAL_CAPSULE | Freq: Every day | ORAL | Status: DC
  Administered 2023-05-02 – 2023-05-04 (×3): 0.4 mg via ORAL
  Filled 2023-05-02 (×3): qty 1

## 2023-05-02 MED ORDER — LIDOCAINE HCL 1 % IJ SOLN
10.0000 mL | Freq: Once | INTRAMUSCULAR | Status: AC
Start: 2023-05-02 — End: 2023-05-02
  Administered 2023-05-02: 8 mL via INTRADERMAL
  Filled 2023-05-02: qty 10

## 2023-05-02 MED ORDER — POLYETHYLENE GLYCOL 3350 17 G PO PACK
17.0000 g | PACK | Freq: Every day | ORAL | Status: DC
  Administered 2023-05-03: 17 g via ORAL
  Filled 2023-05-02 (×3): qty 1

## 2023-05-02 MED ORDER — SENNOSIDES-DOCUSATE SODIUM 8.6-50 MG PO TABS
2.0000 | ORAL_TABLET | Freq: Every day | ORAL | Status: DC
  Administered 2023-05-03: 2 via ORAL
  Filled 2023-05-02 (×2): qty 2

## 2023-05-02 MED ORDER — COLCHICINE 0.6 MG PO TABS
0.6000 mg | ORAL_TABLET | Freq: Every day | ORAL | Status: DC
  Administered 2023-05-03: 0.6 mg via ORAL
  Filled 2023-05-02 (×3): qty 1

## 2023-05-02 MED ORDER — HYDROMORPHONE HCL 1 MG/ML IJ SOLN
0.5000 mg | INTRAMUSCULAR | Status: DC | PRN
  Administered 2023-05-02: 0.5 mg via INTRAVENOUS
  Filled 2023-05-02: qty 1

## 2023-05-02 MED ORDER — HYDROMORPHONE HCL 1 MG/ML IJ SOLN
1.0000 mg | INTRAMUSCULAR | Status: DC | PRN
  Administered 2023-05-02 – 2023-05-03 (×3): 1 mg via INTRAVENOUS
  Filled 2023-05-02 (×4): qty 1

## 2023-05-02 MED ORDER — ONDANSETRON HCL 4 MG PO TABS: 4.0000 mg | ORAL_TABLET | Freq: Four times a day (QID) | ORAL | Status: DC | PRN

## 2023-05-02 MED ORDER — LACTATED RINGERS IV SOLN
150.0000 mL/h | INTRAVENOUS | Status: DC
  Administered 2023-05-02: 150 mL/h via INTRAVENOUS

## 2023-05-02 MED ORDER — FENTANYL CITRATE (PF) 100 MCG/2ML IJ SOLN
INTRAMUSCULAR | Status: AC | PRN
  Administered 2023-05-02: 25 ug via INTRAVENOUS

## 2023-05-02 MED ORDER — LACTATED RINGERS IV BOLUS (SEPSIS)
500.0000 mL | Freq: Once | INTRAVENOUS | Status: AC
  Administered 2023-05-02: 500 mL via INTRAVENOUS

## 2023-05-02 MED ORDER — TRAZODONE HCL 50 MG PO TABS: 25.0000 mg | ORAL_TABLET | Freq: Every evening | ORAL | Status: DC | PRN

## 2023-05-02 MED ORDER — PIPERACILLIN-TAZOBACTAM 3.375 G IVPB
3.3750 g | Freq: Three times a day (TID) | INTRAVENOUS | Status: DC
  Administered 2023-05-02 – 2023-05-04 (×7): 3.375 g via INTRAVENOUS
  Filled 2023-05-02 (×7): qty 50

## 2023-05-02 NOTE — Consult Note (Signed)
Patient ID: Adrian Aguilar, male   DOB: [birth year], 89 y.o.   MRN: [id number] CC: Acute Cholecystitis History of Present Illness Adrian Aguilar is a 89 y.o. male with past medical history significant for AAA status post endovascular repair, chronic kidney disease who presents in consultation for acute cholecystitis.  The patient was admitted approximately 2 weeks ago for altered mental status and at that time was found to have constipation and urinary retention.  A Foley catheter was placed and he was given a bowel regimen and recovered.  He was recommended to go to. To a skilled nursing facility.  He was discharged there but then his wife took him home.  She says that he had been doing well over the last several days.  However yesterday started to develop abdominal pain and nausea and vomiting.  He had numerous episodes of emesis.  His mental status also declined so he was brought to the emergency room for evaluation.  He had a CT head as well as a CT abdomen and pelvis.  The CT abdomen pelvis was significant for inflammation in the right upper quadrant and distention of the gallbladder.  He then subsequently underwent a ultrasound that showed gallbladder wall distention.  He had labs that were notable for a leukocytosis of 30,000.  Past Medical History Past Medical History:  Diagnosis Date   AAA (abdominal aortic aneurysm) (HCC)    Hypertension    PAD (peripheral artery disease) (HCC)    PAD with stent placed to artery in R leg   Renal disorder    stage 3 CKD       Past Surgical History:  Procedure Laterality Date   CATARACT EXTRACTION W/ INTRAOCULAR LENS IMPLANT Bilateral     Allergies  Allergen Reactions   Meat Extract Itching, Other (See Comments) and Rash    Red meat   Rosuvastatin Other (See Comments)    SEVERE LEG PAIN   Allopurinol Other (See Comments)    Lethargy   Propofol Other (See Comments)    Other   Shellfish Allergy Other (See Comments)    According to wife. N/V only     Current Facility-Administered Medications  Medication Dose Route Frequency Provider Last Rate Last Admin   0.9 %  sodium chloride infusion   Intravenous Continuous Marrion Coy, MD       acetaminophen (TYLENOL) tablet 650 mg  650 mg Oral Q6H PRN Mansy, Jan A, MD       Or   acetaminophen (TYLENOL) suppository 650 mg  650 mg Rectal Q6H PRN Mansy, Jan A, MD       ascorbic acid (VITAMIN C) tablet 1,000 mg  1,000 mg Oral Daily Mansy, Jan A, MD       colchicine tablet 0.6 mg  0.6 mg Oral QHS Mansy, Jan A, MD       enoxaparin (LOVENOX) injection 40 mg  40 mg Subcutaneous Q24H Mansy, Jan A, MD       febuxostat (ULORIC) tablet 40 mg  40 mg Oral Daily Mansy, Jan A, MD       hydrALAZINE (APRESOLINE) injection 10 mg  10 mg Intravenous Q6H PRN Mansy, Jan A, MD   10 mg at 05/02/23 0419   labetalol (NORMODYNE) injection 20 mg  20 mg Intravenous Q3H PRN Mansy, Jan A, MD       lisinopril (ZESTRIL) tablet 20 mg  20 mg Oral Daily Mansy, Jan A, MD       ondansetron [facility]) tablet 4 mg  4 mg  Oral Q6H PRN Mansy, Vernetta Honey, MD       Or   ondansetron [facility]) injection 4 mg  4 mg Intravenous Q6H PRN Mansy, Jan A, MD       piperacillin-tazobactam (ZOSYN) IVPB 3.375 g  3.375 g Intravenous Q8H Otelia Sergeant, RPH 12.5 mL/hr at 05/02/23 1610 3.375 g at 05/02/23 9604   polyethylene glycol (MIRALAX / GLYCOLAX) packet 17 g  17 g Oral Daily Mansy, Jan A, MD       senna-docusate (Senokot-S) tablet 2 tablet  2 tablet Oral QHS Mansy, Jan A, MD       tamsulosin [facility]) capsule 0.4 mg  0.4 mg Oral Daily Mansy, Jan A, MD       traZODone (DESYREL) tablet 25 mg  25 mg Oral QHS PRN Mansy, Vernetta Honey, MD       Current Outpatient Medications  Medication Sig Dispense Refill   Apoaequorin 10 MG CAPS Take 1 tablet by mouth daily.     ascorbic acid (VITAMIN C) 1000 MG tablet Take 1,000 mg by mouth daily.     aspirin EC 81 MG tablet Take 81 mg by mouth daily. Swallow whole.     colchicine 0.6 MG tablet Take 0.5 tablets (0.3 mg total) by  mouth daily.     febuxostat (ULORIC) 40 MG tablet Take 40 mg by mouth daily.     fluticasone (FLONASE) 50 MCG/ACT nasal spray Place 2 sprays into both nostrils daily as needed for rhinitis or allergies.     lisinopril (ZESTRIL) 20 MG tablet Take 20 mg by mouth daily.     polyethylene glycol (MIRALAX) 17 g packet Take 17 g by mouth daily. 30 each 0   PRALUENT 75 MG/ML SOAJ Inject 75 mg into the skin as directed. 75 mg under skin every 14 days     senna-docusate (SENOKOT-S) 8.6-50 MG tablet Take 2 tablets by mouth at bedtime. 60 tablet 2   tamsulosin (FLOMAX) 0.4 MG CAPS capsule Take 1 capsule (0.4 mg total) by mouth daily.      Family History Family History  Problem Relation Age of Onset   Diabetes Brother        Social History Social History   Tobacco Use   Smoking status: Never   Smokeless tobacco: Never  Substance Use Topics   Alcohol use: Not Currently   Drug use: Never        ROS Full ROS of systems performed and is otherwise negative there than what is stated in the HPI  Physical Exam Blood pressure (!) 164/60, pulse 88, temperature 97.7 F (36.5 C), temperature source Oral, resp. rate 18, height 5\' 11"  (1.803 m), weight 78.7 kg, SpO2 99%.  Patient is only alert to self.  He is not alert to place or time.  Normal work of breathing on room air, regular rate and rhythm, abdomen is slightly distend, tender to palpation throughout abdomen but without rebound tenderness or involutnary guarding, no peritonitis Data Reviewed His labs are significant for hyponatremia to 120.  His creatinine seems to be actually below his baseline at 1.29.Marland Kitchen  He has a leukocytosis to 31,000.  I reviewed his CT scan.  There does seem to be a good degree of inflammation in the right upper quadrant and insistent gallbladder on CT scan.  His ultrasound is notable for sludge and distended gallbladder  I have personally reviewed the patient's imaging and medical records.    Assessment    89 year old  male with 1 day history  of altered mental status and abdominal pain.  CT scan concerning for acute cholecystitis as well as ultrasound that also is concerning for this.  He has a profound leukocytosis to 31,000.  I discussed at length with the patient's wife as he is not alert and oriented to participate in discussion of care for himself.  We talked about the options for treating cholecystitis including surgery, antibiotics and cholecystostomy tube.  Given that he is not alert and oriented and he has a profound leukocytosis I am concerned that surgical intervention would be disastrous for him.  For that reason the best option for him is to treat him with antibiotics and to place a cholecystostomy tube.  I discussed this with the patient's wife and she was quite frustrated with this outcome and would prefer surgery.  We talked that this is not an option for him and at this time he is not a candidate for surgery.  Again she was quite frustrated with this so I offered her a second opinion.  I discussed the patient case with one of my partners Dr. Maia Plan and he agreed that the best treatment option is to perform a cholecystostomy tube over operative intervention given the patient's age and functional status as well as the degree of inflammation on imaging.  I also discussed the case with Dr. Randa Ngo our on-call anesthesiologist and he agreed that any general anesthetic for surgical intervention would be extraordinarily detrimental to his outcome.  For now recommend treating him with broad-spectrum antibiotics to include Zosyn.  I have put in the consult with IR for cholecystostomy tube.  Please keep the patient n.p.o. for now.    Kandis Cocking 05/02/2023, 9:25 AM

## 2023-05-02 NOTE — Procedures (Signed)
  Procedure:  Perc CHolecystostomy catheter placement 2f Preprocedure diagnosis: The primary encounter diagnosis was Sepsis, due to unspecified organism, unspecified whether acute organ dysfunction present (HCC). Diagnoses of Cholecystitis and Altered mental status, unspecified altered mental status type were also pertinent to this visit. Postprocedure diagnosis: same EBL:    minimal Complications:   none immediate  See full dictation in [employer].  Thora Lance MD Main # [phone] Pager  [phone] Mobile [phone]

## 2023-05-02 NOTE — ED Notes (Signed)
Per Surgery, Pt is not a candidate for procedure.

## 2023-05-02 NOTE — ED Notes (Signed)
Pt remains in a significant amount of pain.  Hospitalist notified.

## 2023-05-02 NOTE — ED Notes (Signed)
MD at bedside.

## 2023-05-02 NOTE — Progress Notes (Signed)
Patient tolerated procedure well. Patient had difficulty getting comfortable in the stretcher when repositioned. Stated it was back that was hurting. Per wife, he has chronic back pain. Pt was repositioned multiple times with some improvement with the help of other staff. Drain to right quadrant c/d/i, draining brown liquid. Report given to ED nurse Whitney Post

## 2023-05-02 NOTE — Progress Notes (Signed)
  Progress Note   Patient: Adrian Aguilar ZHY:[id number] DOB: Sep 17, 1932 DOA: 05/01/2023     0 DOS: the patient was seen and examined on 05/02/2023   Brief hospital course: Adrian Aguilar is a 89 y.o. Caucasian male with medical history significant for essential hypertension and stage III chronic kidney disease, peripheral arterial disease, abdominal aortic aneurysm, and manage who presented to the emergency room with acute onset of abdominal pain and nausea vomiting.  Patient also was very confused before admission. Heart rate up to 100, respirate up to 31, severe leukocytosis of 32.  Ultrasound showed acute cholecystitis with sludge. Patient was seen by general surgery, scheduled for cholecystostomy.  Patient also placed on Zosyn and IV fluids.   Principal Problem:   Cholecystitis Active Problems:   Sepsis due to undetermined organism (HCC)   Hypertensive urgency   Essential hypertension   CKD stage 3b, GFR 30-44 ml/min (HCC)   Dementia (HCC)   Gout   BPH (benign prostatic hyperplasia)   Hyponatremia   Assessment and Plan:  *Acute cholecystitis with gallbladder sludge. Patient met sepsis criteria as above.  Ultrasound showed evidence of acute cholecystitis with gallbladder sludge. Per general surgery, patient will receiving cholecystostomy today. Continue antibiotics with Zosyn.  Continue IV fluids, started Dilaudid for pain control.  Chronic kidney disease stage IIIb. Hyponatremia. Sodium level dropped down to 129, fluids changed to normal saline.  Continue to monitor.  Renal function stable.   Hypertensive urgency The patient be placed on as needed IV hydralazine and labetalol.   BPH (benign prostatic hyperplasia) with urinary retention. On Flomax, also has chronic Foley catheter.  Will follow.  Gout - We will continue colchicine and Uloric.      Subjective:  Patient still have significant right upper quadrant abdominal pain, confused.  Denies any short of  breath.  Physical Exam: Vitals:   05/02/23 0930 05/02/23 1000 05/02/23 1004 05/02/23 1030  BP: (!) 166/61 (!) 140/46 (!) 140/46 (!) 156/59  Pulse: 88 88  87  Resp: (!) 21 (!) 29  12  Temp:      TempSrc:      SpO2: 99% 100%  96%  Weight:      Height:       General exam: Appears acutely ill and uncomfortable. Respiratory system: Clear to auscultation. Respiratory effort normal. Cardiovascular system: S1 & S2 heard, RRR. No JVD, murmurs, rubs, gallops or clicks. No pedal edema. Gastrointestinal system: Abdomen is nondistended, soft and RUQ tender. No organomegaly or masses felt. Normal bowel sounds heard. Central nervous system: Alert and oriented x2. No focal neurological deficits. Extremities: Symmetric 5 x 5 power. Skin: No rashes, lesions or ulcers Psychiatry: Mood & affect appropriate.    Data Reviewed:  Reviewed ultrasound results and lab results.  Family Communication: Wife updated at bedside.  Disposition: Status is: Inpatient Remains inpatient appropriate because: Severity of disease, inpatient procedure, IV antibiotics.     Time spent: No charge minutes  Author: Marrion Coy, MD 05/02/2023 11:20 AM  For on call review [email or web address].

## 2023-05-02 NOTE — Assessment & Plan Note (Addendum)
-   The patient be admitted to a medical telemetry bed. - Will continue antibiotic therapy with IV Zosyn. - Pain management will be provided. - Surgery consult to be obtained. - Dr. Maurine Minister was notified and is aware about the patient. - The patient will be kept NPO. - We will hold off aspirin.

## 2023-05-02 NOTE — Hospital Course (Addendum)
Adrian Aguilar is a 89 y.o. Caucasian male with medical history significant for essential hypertension and stage III chronic kidney disease, peripheral arterial disease, abdominal aortic aneurysm, and manage who presented to the emergency room with acute onset of abdominal pain and nausea vomiting.  Patient also was very confused before admission. Heart rate up to 100, respirate up to 31, severe leukocytosis of 32.  Ultrasound showed acute cholecystitis with sludge. Patient was seen by general surgery, scheduled for cholecystostomy.  Patient also placed on Zosyn and IV fluids.

## 2023-05-02 NOTE — ED Notes (Signed)
Pt uncomfortable and complaining of chronic back pain since returning from procedure. Pt adjusted multiple times in bed. MD messaged regarding pt unable to get comfortable. MD advised to continue with PRN medications as ordered. Family made aware that pt can receive more pain medication at 1600.

## 2023-05-02 NOTE — Consult Note (Signed)
Chief Complaint: Patient was seen in consultation today for  Chief Complaint  Patient presents with   Abdominal Pain    Pt coming from home d.t lower abd pain with nausea, vomiting, and redness around urethral catheter site today. Pt CBG-=209 with ems. Pt is usually altered per ems and he was d/c on 04/22/23 d/t Catheter foley issues   at the request of Dennis,Samuel O  Referring Physician(s): Dennis,Samuel O  Supervising Physician: Oley Balm  Patient Status: [facility] - ED  History of Present Illness: Adrian Aguilar is a 89 y.o. male h/o dementia, presenting with abd pain. CT shows GB wall thickening and regional inflammatory change, no calcified stones. [place] confirms GB wall thickening with positive sonographic Murphy sign c/w acute cholecystitis. No biliary ductal dilitation. Gen surg requests perc drainage for source control, and  to allow potential uncomplicated elective cholecystectomy in the future. No problems with anesthesia. No anticoagulants.  Past Medical History:  Diagnosis Date   AAA (abdominal aortic aneurysm) (HCC)    Hypertension    PAD (peripheral artery disease) (HCC)    PAD with stent placed to artery in R leg   Renal disorder    stage 3 CKD    Past Surgical History:  Procedure Laterality Date   CATARACT EXTRACTION W/ INTRAOCULAR LENS IMPLANT Bilateral     Allergies: Meat extract, Rosuvastatin, Allopurinol, Propofol, and Shellfish allergy  Medications: Prior to Admission medications   Medication Sig Start Date End Date Taking? Authorizing Provider  Apoaequorin 10 MG CAPS Take 1 tablet by mouth daily.    [provider]  ascorbic acid (VITAMIN C) 1000 MG tablet Take 1,000 mg by mouth daily. 12/10/17   [provider]  aspirin EC 81 MG tablet Take 81 mg by mouth daily. Swallow whole.    [provider]  colchicine 0.6 MG tablet Take 0.5 tablets (0.3 mg total) by mouth daily. 04/22/23   Loyce Dys, MD  febuxostat (ULORIC) 40  MG tablet Take 40 mg by mouth daily.    [provider]  fluticasone (FLONASE) 50 MCG/ACT nasal spray Place 2 sprays into both nostrils daily as needed for rhinitis or allergies. 07/15/18   [provider]  lisinopril (ZESTRIL) 20 MG tablet Take 20 mg by mouth daily.    [provider]  polyethylene glycol (MIRALAX) 17 g packet Take 17 g by mouth daily. 04/18/23   Osvaldo Shipper, MD  PRALUENT 75 MG/ML SOAJ Inject 75 mg into the skin as directed. 75 mg under skin every 14 days    [provider]  senna-docusate (SENOKOT-S) 8.6-50 MG tablet Take 2 tablets by mouth at bedtime. 04/18/23   Osvaldo Shipper, MD  tamsulosin (FLOMAX) 0.4 MG CAPS capsule Take 1 capsule (0.4 mg total) by mouth daily. 04/22/23   Loyce Dys, MD     Family History  Problem Relation Age of Onset   Diabetes Brother     Social History   Socioeconomic History   Marital status: Unknown    Spouse name: Not on file   Number of children: Not on file   Years of education: Not on file   Highest education level: Not on file  Occupational History   Not on file  Tobacco Use   Smoking status: Never   Smokeless tobacco: Never  Substance and Sexual Activity   Alcohol use: Not Currently   Drug use: Never   Sexual activity: Not Currently  Other Topics Concern   Not on file  Social  History Narrative   Not on file   Social Drivers of Health   Financial Resource Strain: Low Risk  (03/08/2022)   Received from [facility]   Overall Financial Resource Strain (CARDIA)    Difficulty of Paying Living Expenses: Not hard at all  Food Insecurity: No Food Insecurity (04/20/2023)   Hunger Vital Sign    Worried About Running Out of Food in the Last Year: Never true    Ran Out of Food in the Last Year: Never true  Transportation Needs: No Transportation Needs (04/20/2023)   PRAPARE - Administrator, Civil Service (Medical): No    Lack of Transportation (Non-Medical): No  Physical  Activity: Unknown (03/08/2022)   Received from [facility]   Exercise Vital Sign    Days of Exercise per Week: 0 days    Minutes of Exercise per Session: Not on file  Stress: No Stress Concern Present (03/08/2022)   Received from [facility] - Occupational Stress Questionnaire    Feeling of Stress : Not at all  Social Connections: Moderately Isolated (04/20/2023)   Social Connection and Isolation Panel [NHANES]    Frequency of Communication with Friends and Family: Once a week    Frequency of Social Gatherings with Friends and Family: Once a week    Attends Religious Services: 1 to 4 times per year    Active Member of [employer] or Organizations: No    Attends Banker Meetings: Never    Marital Status: Married    ECOG Status: 3 - Symptomatic, >50% confined to bed  Review of Systems: A 12 point ROS discussed and pertinent positives are indicated in the HPI above.  All other systems are negative.  Review of Systems  Vital Signs: BP 134/76   Pulse 90   Temp 97.7 F (36.5 C) (Oral)   Resp (!) 23   Ht 5\' 11"  (1.803 m)   Wt 78.7 kg   SpO2 98%   BMI 24.20 kg/m   Advance Care Plan: The advanced care plan/surrogate decision maker was discussed at the time of visit and documented in the medical record.    Physical Exam Constitutional: Oriented to person, place, and time. Well-developed and well-nourished. No distress.  Last Weight  Most recent update: 05/01/2023  9:54 PM    Weight  78.7 kg (173 lb 8 oz)            HENT:  Head: Normocephalic and atraumatic.  Eyes: Conjunctivae and EOM are normal. Right eye exhibits no discharge. Left eye exhibits no discharge. No scleral icterus.  Neck: No JVD present.  Pulmonary/Chest: Effort normal. No stridor. No respiratory distress.  Abdomen: soft, non distended Neurological: somnolent, did not answer questions or follow commands Skin: Skin is warm and dry.  not diaphoretic.   Psychiatric:  arousable but not coherent  Imaging: US ABDOMEN LIMITED RUQ (LIVER/GB) Result Date: 05/02/2023 CLINICAL DATA:  Abdominal pain.  Possible cholecystitis on CT. EXAM: ULTRASOUND ABDOMEN LIMITED RIGHT UPPER QUADRANT COMPARISON:  CTA abdomen and pelvis 05/01/2023 FINDINGS: Gallbladder: Gallbladder sludge is present. The anterior gallbladder wall is thickened measuring up to 3.3 mm. Sonographic Eulah Pont sign is positive per sonographer. No calcified gallstones are seen. Common bile duct: Diameter: 5.1 mm Liver: No focal lesion identified. Increase in parenchymal echogenicity. Portal vein is patent on color Doppler imaging with normal direction of blood flow towards the liver. Other: None. IMPRESSION: 1. Gallbladder sludge with gallbladder wall thickening and positive  sonographic Murphy sign. Findings are concerning for acute cholecystitis. 2. Increased hepatic parenchymal echogenicity suggestive of steatosis. Electronically Signed   By: Darliss Cheney M.D.   On: 05/02/2023 00:47   CT Angio Abd/Pel W and/or Wo Contrast Result Date: 05/02/2023 CLINICAL DATA:  History of abdominal aortic aneurysm with stent graft therapy EXAM: CTA ABDOMEN AND PELVIS WITHOUT AND WITH CONTRAST TECHNIQUE: Multidetector CT imaging of the abdomen and pelvis was performed using the standard protocol during bolus administration of intravenous contrast. Multiplanar reconstructed images and MIPs were obtained and reviewed to evaluate the vascular anatomy. RADIATION DOSE REDUCTION: This exam was performed according to the departmental dose-optimization program which includes automated exposure control, adjustment of the mA and/or kV according to patient size and/or use of iterative reconstruction technique. CONTRAST:  OMNIPAQUE IOHEXOL 350 MG/ML SOLN COMPARISON:  04/17/2023 FINDINGS: VASCULAR Aorta: Atherosclerotic calcifications of the abdominal aorta are noted. Infrarenal abdominal aortic aneurysm is seen with aortic stent  graft placement. Stent graft is widely patent. No abnormal enhancement is identified within the aneurysm sac to suggest endoleak. Aorta measures approximately 5.7 cm in greatest diameter stable in appearance from the prior exam. 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 SMA: Calcified origin of the SMA with moderate stenosis. Renals: Single renal arteries are identified bilaterally. Atherosclerotic calcifications are seen without focal hemodynamically significant stenosis. 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 The distal IMA appears within normal limits. Inflow: Stent graft extends into the common iliac arteries bilaterally. Diffuse atherosclerotic calcifications are seen without focal hemodynamically significant stenosis. Right femoral bypass graft is noted and patent as visualized. Veins: No specific venous abnormality is noted. Review of the MIP images confirms the above findings. NON-VASCULAR Lower chest: Mild atelectatic changes in the bases bilaterally. Hepatobiliary: Liver is within normal limits. Gallbladder is well distended. Some pericholecystic inflammatory changes are noted and indistinct walls which may represent acute cholecystitis. Ultrasound would be helpful for further evaluation. Pancreas: Unremarkable. No pancreatic ductal dilatation or surrounding inflammatory changes. Spleen: Normal in size without focal abnormality. Adrenals/Urinary Tract: Adrenal glands are within normal limits. Kidneys demonstrate a normal enhancement pattern. Simple right renal cyst is again noted and stable. No follow-up is recommended. No calculi are seen. No obstructive changes are noted. The bladder is decompressed by Foley catheter. Stomach/Bowel: Scattered diverticular change of the colon is noted without evidence of diverticulitis. Fecal material is  seen throughout the colon as well without obstructive change. Appendix is within normal limits. Small bowel shows no obstructive changes. Stomach is within normal limits. Lymphatic: No lymphadenopathy is noted. Reproductive: Prostate is unremarkable. Other: No abdominal wall hernia or abnormality. No abdominopelvic ascites. Musculoskeletal: No acute or significant osseous findings. IMPRESSION: VASCULAR Patent aortic stent graft without evidence of endoleak. 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 Patent right fem-distal bypass proximally the distal aspect of the graft is not visualized. NON-VASCULAR Pericholecystic inflammatory changes are noted suspicious for acute cholecystitis. Ultrasound may be helpful for further evaluation. Diverticulosis without diverticulitis. Bibasilar atelectasis. Electronically Signed   By: Alcide Clever M.D.   On: 05/02/2023 00:02   CT HEAD WO CONTRAST ( ) Result Date: 05/01/2023 CLINICAL DATA:  Altered mental status EXAM: CT HEAD WITHOUT CONTRAST TECHNIQUE: Contiguous axial images were obtained from the base  of the skull through the vertex without intravenous contrast. RADIATION DOSE REDUCTION: This exam was performed according to the departmental dose-optimization program which includes automated exposure control, adjustment of the mA and/or kV according to patient size and/or use of iterative reconstruction technique. COMPARISON:  04/17/2023 FINDINGS: Brain: No evidence of acute infarction, hemorrhage, hydrocephalus, extra-axial collection or mass lesion/mass effect. Chronic mild atrophic changes and white matter ischemic changes are noted. Vascular: No hyperdense vessel or unexpected calcification. Skull: Normal. Negative for fracture or focal lesion. Sinuses/Orbits: No acute finding. Other: None. IMPRESSION: No acute intracranial abnormality noted.  Electronically Signed   By: Alcide Clever M.D.   On: 05/01/2023 23:53   DG Chest Port 1 View Result Date: 05/01/2023 CLINICAL DATA:  Questionable sepsis. EXAM: PORTABLE CHEST 1 VIEW COMPARISON:  Portable chest 04/17/2023 FINDINGS: The heart size and mediastinal contours are within normal limits. Calcific plaques are again noted in the aortic arch. Both lungs are clear of infiltrates with linear scarring or atelectasis in the left base. The depth of inspiration is less than previously with subsegmental atelectatic change in the right base as well. The visualized skeletal structures are unremarkable. IMPRESSION: No evidence of acute chest disease. Bibasilar subsegmental atelectasis with decreased inspiration. Aortic atherosclerosis. Electronically Signed   By: Almira Bar M.D.   On: 05/01/2023 21:42   CT ABDOMEN PELVIS W CONTRAST Result Date: 04/17/2023 CLINICAL DATA:  Diarrhea.  Abdominal pain. EXAM: CT ABDOMEN AND PELVIS WITH CONTRAST TECHNIQUE: Multidetector CT imaging of the abdomen and pelvis was performed using the standard protocol following bolus administration of intravenous contrast. RADIATION DOSE REDUCTION: This exam was performed according to the departmental dose-optimization program which includes automated exposure control, adjustment of the mA and/or kV according to patient size and/or use of iterative reconstruction technique. CONTRAST:  80mL OMNIPAQUE IOHEXOL 300 MG/ML  SOLN COMPARISON:  None Available. FINDINGS: Lower chest: Mild dependent atelectasis is present both lungs. Lungs otherwise clear. The heart size is normal. Artery calcifications are present. No significant pleural or pericardial effusion is present. Hepatobiliary: No focal liver abnormality is seen. No gallstones, gallbladder wall thickening, or biliary dilatation. Pancreas: Unremarkable. No pancreatic ductal dilatation or surrounding inflammatory changes. Spleen: Normal in size without focal abnormality. Adrenals/Urinary  Tract: The adrenal glands are normal bilaterally. A simple cyst at the upper pole of the right kidney measures 3.2 cm. A posterior simple cyst measures 10 mm. Smaller cysts are present at the lower pole of the left kidney. No follow-up imaging is recommended no stone or solid mass lesion is present. No obstruction is present. The ureters are within normal limits. A Foley catheter is present within the urinary bladder. Stomach/Bowel: The stomach and duodenum are within normal limits. Small bowel is unremarkable. Terminal ileum is within normal limits. The appendix is visualized and normal. The ascending and transverse colon are normal. Diverticular changes are present in the distal descending and at the sigmoid colon. No focal inflammatory changes are present to suggest diverticulitis. Moderate stool distends the rectum to 7.2 cm and transverse diameter. There is some wall thickening and gas around the stool ball without significant inflammatory change. No proximal obstruction is present. Vascular/Lymphatic: An aortic bi-femoral stent graft is in place. Atherosclerotic calcifications are present in the more proximal aorta and branch vessels. Calcifications are present in the external iliac arteries and femoral arteries. 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 Reproductive: Prostate is unremarkable. Other: No abdominal wall hernia or abnormality. No abdominopelvic ascites. Musculoskeletal: Grade 1 degenerative anterolisthesis is present at L4-5. Moderate  facet hypertrophy is present bilaterally at L4-5 and L5-S1. No focal osseous lesions are present. The bony pelvis is normal. The hips are located and normal bilaterally. IMPRESSION: 1. Moderate stool distends the rectum to 7.2 cm and transverse diameter. There is some wall thickening and gas around the stool ball without significant inflammatory change. Stool impaction may be the source of the patient's pain. 2. Descending and sigmoid diverticulosis without diverticulitis. 3.  Aortic bi-femoral stent graft in place. 4. Grade 1 degenerative anterolisthesis at L4-5. 5. Moderate facet hypertrophy bilaterally at L4-5 and L5-S1. 6.  Aortic Atherosclerosis (ICD10-I70.0). Electronically Signed   By: Marin Roberts M.D.   On: 04/17/2023 17:37   CT Head Wo Contrast Result Date: 04/17/2023 CLINICAL DATA:  Altered mental status.  Fall EXAM: CT HEAD WITHOUT CONTRAST CT CERVICAL SPINE WITHOUT CONTRAST TECHNIQUE: Multidetector CT imaging of the head and cervical spine was performed following the standard protocol without intravenous contrast. Multiplanar CT image reconstructions of the cervical spine were also generated. RADIATION DOSE REDUCTION: This exam was performed according to the departmental dose-optimization program which includes automated exposure control, adjustment of the mA and/or kV according to patient size and/or use of iterative reconstruction technique. COMPARISON:  None Available. FINDINGS: CT HEAD FINDINGS Brain: No acute intracranial hemorrhage. No focal mass lesion. No CT evidence of acute infarction. No midline shift or mass effect. No hydrocephalus. Basilar cisterns are patent. There are periventricular and subcortical white matter hypodensities. Generalized cortical atrophy. Vascular: No hyperdense vessel or unexpected calcification. Skull: Normal. Negative for fracture or focal lesion. Sinuses/Orbits: Paranasal sinuses and mastoid air cells are clear. Orbits are clear. Other: None. CT CERVICAL SPINE FINDINGS Alignment: Normal alignment of the cervical vertebral bodies. Skull base and vertebrae: Normal craniocervical junction. No loss of vertebral body height or disc height. Normal facet articulation. No evidence of fracture. Soft tissues and spinal canal: No prevertebral soft tissue swelling. No perispinal or epidural hematoma. Disc levels: Anterior osteophytes from C5 to C7 with mild disc space narrowing. No acute findings. Upper chest: Clear Other: None IMPRESSION:  1. No acute intracranial findings. 2. Atrophy and white matter microvascular disease. 3. No cervical spine fracture. 4. Mild multilevel disc osteophytic disease. Electronically Signed   By: Genevive Bi M.D.   On: 04/17/2023 16:23   CT Cervical Spine Wo Contrast Result Date: 04/17/2023 CLINICAL DATA:  Altered mental status.  Fall EXAM: CT HEAD WITHOUT CONTRAST CT CERVICAL SPINE WITHOUT CONTRAST TECHNIQUE: Multidetector CT imaging of the head and cervical spine was performed following the standard protocol without intravenous contrast. Multiplanar CT image reconstructions of the cervical spine were also generated. RADIATION DOSE REDUCTION: This exam was performed according to the departmental dose-optimization program which includes automated exposure control, adjustment of the mA and/or kV according to patient size and/or use of iterative reconstruction technique. COMPARISON:  None Available. FINDINGS: CT HEAD FINDINGS Brain: No acute intracranial hemorrhage. No focal mass lesion. No CT evidence of acute infarction. No midline shift or mass effect. No hydrocephalus. Basilar cisterns are patent. There are periventricular and subcortical white matter hypodensities. Generalized cortical atrophy. Vascular: No hyperdense vessel or unexpected calcification. Skull: Normal. Negative for fracture or focal lesion. Sinuses/Orbits: Paranasal sinuses and mastoid air cells are clear. Orbits are clear. Other: None. CT CERVICAL SPINE FINDINGS Alignment: Normal alignment of the cervical vertebral bodies. Skull base and vertebrae: Normal craniocervical junction. No loss of vertebral body height or disc height. Normal facet articulation. No evidence of fracture. Soft tissues and spinal canal: No prevertebral  soft tissue swelling. No perispinal or epidural hematoma. Disc levels: Anterior osteophytes from C5 to C7 with mild disc space narrowing. No acute findings. Upper chest: Clear Other: None IMPRESSION: 1. No acute  intracranial findings. 2. Atrophy and white matter microvascular disease. 3. No cervical spine fracture. 4. Mild multilevel disc osteophytic disease. Electronically Signed   By: Genevive Bi M.D.   On: 04/17/2023 16:23   DG Chest Port 1 View Result Date: 04/17/2023 CLINICAL DATA:  Sepsis, altered mental status EXAM: PORTABLE CHEST 1 VIEW COMPARISON:  None Available. FINDINGS: Atherosclerotic calcification of the aortic arch. The overlap of the left posterior seventh rib and inferior scapular tip, an 8 mm density is present and a true intrapulmonary nodule is not excluded. Chest CT is recommended for further characterization. The lungs appear otherwise clear. Cardiac and mediastinal margins appear normal. IMPRESSION: 1. 8 mm density in the left lung base, possibly a true intrapulmonary nodule. Chest CT is recommended for further characterization. 2.  Aortic Atherosclerosis (ICD10-I70.0). Electronically Signed   By: Gaylyn Rong M.D.   On: 04/17/2023 15:47    Labs:  CBC: Recent Labs    04/21/23 0523 04/22/23 0508 05/01/23 2137 05/02/23 0411  WBC 12.2* 7.4 32.0* 31.7*  HGB 13.0 12.6* 15.5 13.8  HCT 37.5* 36.4* 44.2 39.1  PLT 281 244 419* 370    COAGS: Recent Labs    04/17/23 1431 05/01/23 2137 05/02/23 0411  INR 1.0 1.1 1.3*  APTT  --  22*  --     BMP: Recent Labs    04/21/23 0523 04/22/23 0508 05/01/23 2137 05/02/23 0411  NA 137 136 131* 129*  K 4.2 3.8 4.8 4.5  CL 106 106 95* 97*  CO2 22 21* 20* 22  GLUCOSE 103* 108* 173* 155*  BUN 30* 33* 26* 24*  CALCIUM 8.8* 8.8* 9.7 9.0  CREATININE 2.18* 1.71* 1.36* 1.29*  GFRNONAA 28* 38* 50* 53*    LIVER FUNCTION TESTS: Recent Labs    04/17/23 1431 05/01/23 2137 05/02/23 0411  BILITOT 0.9 1.3* 1.1  AST 27 21 18   ALT 21 18 15   ALKPHOS 85 71 62  PROT 8.2* 8.1 6.8  ALBUMIN 4.8 4.1 3.5    TUMOR MARKERS: No results for input(s): "AFPTM", "CEA", "CA199", "CHROMGRNA" in the last 8760 hours.  Assessment and  Plan: Acute cholecystitis without rupture. Discussed situtation with pt spouse. Recommend perc cholecystostomy tube placement for source control. Potential elective cholecystectomy after a few weeks or longer. Tube will have to stay in place until then. This procedure has been fully reviewed with the patient and written informed consent has been obtained. Will proceed today.  Thank you for this interesting consult.  I greatly enjoyed meeting Orvell Careaga and look forward to participating in their care.  A copy of this report was sent to the requesting provider on this date.  Electronically Signed: Durwin Glaze, MD 05/02/2023, 1:13 PM   I spent a total of 40 Minutes    in face to face in clinical consultation, greater than 50% of which was counseling/coordinating care for acute cholecystitis.

## 2023-05-02 NOTE — Assessment & Plan Note (Signed)
-   We will resume Flomax.

## 2023-05-02 NOTE — ED Notes (Signed)
500mg  tablet of Ascorbic Acid dropped in floor.  Tablet disposed of and "wasted" in Pyxis.  New tab pulled through "Inventory Count."

## 2023-05-02 NOTE — Assessment & Plan Note (Addendum)
-   The patient will be placed on IV Zosyn as mentioned above. - We will continue hydration with IV ringer. - We will follow blood cultures. - Management otherwise as above.

## 2023-05-02 NOTE — ED Notes (Signed)
Advised nurse that patient has ready bed

## 2023-05-02 NOTE — H&P (Addendum)
Home     [place]   PATIENT NAME: Adrian Aguilar    MR#:  [id number]  DATE OF BIRTH:  03/13/1933  DATE OF ADMISSION:  05/01/2023  PRIMARY CARE PHYSICIAN: Langley Gauss, MD   Patient is coming from: Home  REQUESTING/REFERRING PHYSICIAN: Loleta Rose, MD  CHIEF COMPLAINT:   Chief Complaint  Patient presents with   Abdominal Pain    Pt coming from home d.t lower abd pain with nausea, vomiting, and redness around urethral catheter site today. Pt CBG-=209 with ems. Pt is usually altered per ems and he was d/c on 04/22/23 d/t Catheter foley issues    HISTORY OF PRESENT ILLNESS:  Adrian Aguilar is a 89 y.o. Caucasian male with medical history significant for essential hypertension and stage III chronic kidney disease, peripheral arterial disease, abdominal aortic aneurysm, and manage who presented to the emergency room with acute onset of abdominal pain felt as cramps with associated nausea and vomiting since yesterday.  He has been mildly confused more than his usual per his wife.  No cough or wheezing or dyspnea.  No dysuria, oliguria or hematuria or flank pain.  No chest pain or palpitations.  No fever or chills.  The patient was recently admitted here from 1/11 to 04/22/23 for acute metabolic cephalopathy and acute urinary retention as well as AKI.  ED Course: When the patient came to the ER,, BP was 193/80 with otherwise normal vital signs.  Respiratory rate was 23 later on.  Revealed significant leukocytosis of 32 with neutrophilia of 27.7 and thrombocytosis of 419.  CMP revealed a BUN of 26 and creatinine 1.36 better than previous levels and blood glucose of 173, CO2 20 and chloride 95 with sodium 131.  Total bili was 1.3.  Lactic acid was 1.9.  Respiratory panel came back negative.  UA showed more than 50 RBCs with no bacteria and 0-5 WBCs with 30 protein.  Blood cultures were drawn. EKG as reviewed by me : EKG showed sinus rhythm with a rate of 89 and poor R wave  progression. Imaging: Noncontrasted head CT scan showed no acute intracranial abnormalities.  CTA of the abdomen and pelvis showed pericholecystic inflammatory changes suspicious for acute cholecystitis.  It showed diverticulosis without diverticulitis and bibasilar atelectasis.Right upper quadrant ultrasound revealed gallbladder sludge with gallbladder wall thickening and positive sonographic Murphy sign consistent with acute cholecystitis.  There was increased parenchymal hepatic echogenicity suggestive of steatosis.  The patient was given IV cefepime, Flagyl, vancomycin, 0.5 mg of IV Dilaudid and 4 mg of IV Zofran.  Dr. Maurine Minister was contacted and is aware about the patient.  The patient will be kept NPO.  He will be admitted to a medical telemetry bed for further evaluation and management. PAST MEDICAL HISTORY:   Past Medical History:  Diagnosis Date   AAA (abdominal aortic aneurysm) (HCC)    Hypertension    PAD (peripheral artery disease) (HCC)    PAD with stent placed to artery in R leg   Renal disorder    stage 3 CKD    PAST SURGICAL HISTORY:   Past Surgical History:  Procedure Laterality Date   CATARACT EXTRACTION W/ INTRAOCULAR LENS IMPLANT Bilateral     SOCIAL HISTORY:   Social History   Tobacco Use   Smoking status: Never   Smokeless tobacco: Never  Substance Use Topics   Alcohol use: Not Currently    FAMILY HISTORY:   Family History  Problem Relation Age of Onset   Diabetes Brother  DRUG ALLERGIES:   Allergies  Allergen Reactions   Meat Extract Itching, Other (See Comments) and Rash    Red meat   Rosuvastatin Other (See Comments)    SEVERE LEG PAIN   Allopurinol Other (See Comments)    Lethargy   Propofol Other (See Comments)    Other   Shellfish Allergy Other (See Comments)    According to wife. N/V only    REVIEW OF SYSTEMS:   ROS As per history of present illness. All pertinent systems were reviewed above. Constitutional, HEENT,  cardiovascular, respiratory, GI, GU, musculoskeletal, neuro, psychiatric, endocrine, integumentary and hematologic systems were reviewed and are otherwise negative/unremarkable except for positive findings mentioned above in the HPI.   MEDICATIONS AT HOME:   Prior to Admission medications   Medication Sig Start Date End Date Taking? Authorizing Provider  Apoaequorin 10 MG CAPS Take 1 tablet by mouth daily.    [provider]  ascorbic acid (VITAMIN C) 1000 MG tablet Take 1,000 mg by mouth daily. 12/10/17   [provider]  aspirin EC 81 MG tablet Take 81 mg by mouth daily. Swallow whole.    [provider]  colchicine 0.6 MG tablet Take 0.5 tablets (0.3 mg total) by mouth daily. 04/22/23   Loyce Dys, MD  febuxostat (ULORIC) 40 MG tablet Take 40 mg by mouth daily.    [provider]  fluticasone (FLONASE) 50 MCG/ACT nasal spray Place 2 sprays into both nostrils daily as needed for rhinitis or allergies. 07/15/18   [provider]  lisinopril (ZESTRIL) 20 MG tablet Take 20 mg by mouth daily.    [provider]  polyethylene glycol (MIRALAX) 17 g packet Take 17 g by mouth daily. 04/18/23   Osvaldo Shipper, MD  PRALUENT 75 MG/ML SOAJ Inject 75 mg into the skin as directed. 75 mg under skin every 14 days    [provider]  senna-docusate (SENOKOT-S) 8.6-50 MG tablet Take 2 tablets by mouth at bedtime. 04/18/23   Osvaldo Shipper, MD  tamsulosin (FLOMAX) 0.4 MG CAPS capsule Take 1 capsule (0.4 mg total) by mouth daily. 04/22/23   Loyce Dys, MD      VITAL SIGNS:  Blood pressure (!) 195/82, pulse 94, temperature 97.8 F (36.6 C), temperature source Oral, resp. rate (!) 22, height 5\' 11"  (1.803 m), weight 78.7 kg, SpO2 99%.  PHYSICAL EXAMINATION:  Physical Exam  GENERAL:  89 y.o.-year-old patient lying in the bed with no acute distress.  EYES: Pupils equal, round, reactive to light and accommodation. No scleral icterus. Extraocular  muscles intact.  HEENT: Head atraumatic, normocephalic. Oropharynx and nasopharynx clear.  NECK:  Supple, no jugular venous distention. No thyroid enlargement, no tenderness.  LUNGS: Normal breath sounds bilaterally, no wheezing, rales,rhonchi or crepitation. No use of accessory muscles of respiration.  CARDIOVASCULAR: Regular rate and rhythm, S1, S2 normal. No murmurs, rubs, or gallops.  ABDOMEN: Soft, nondistended, nontender. Bowel sounds present. No organomegaly or mass.  EXTREMITIES: No pedal edema, cyanosis, or clubbing.  NEUROLOGIC: Cranial nerves II through XII are intact. Muscle strength 5/5 in all extremities. Sensation intact. Gait not checked.  PSYCHIATRIC: The patient is alert and oriented x 3.  Normal affect and good eye contact. SKIN: No obvious rash, lesion, or ulcer.   LABORATORY PANEL:   CBC Recent Labs  Lab 05/01/23 2137  WBC 32.0*  HGB 15.5  HCT 44.2  PLT 419*   ------------------------------------------------------------------------------------------------------------------  Chemistries  Recent Labs  Lab 05/01/23 2137  NA  131*  K 4.8  CL 95*  CO2 20*  GLUCOSE 173*  BUN 26*  CREATININE 1.36*  CALCIUM 9.7  AST 21  ALT 18  ALKPHOS 71  BILITOT 1.3*   ------------------------------------------------------------------------------------------------------------------  Cardiac Enzymes No results for input(s): "TROPONINI" in the last 168 hours. ------------------------------------------------------------------------------------------------------------------  RADIOLOGY:  US ABDOMEN LIMITED RUQ (LIVER/GB) Result Date: 05/02/2023 CLINICAL DATA:  Abdominal pain.  Possible cholecystitis on CT. EXAM: ULTRASOUND ABDOMEN LIMITED RIGHT UPPER QUADRANT COMPARISON:  CTA abdomen and pelvis 05/01/2023 FINDINGS: Gallbladder: Gallbladder sludge is present. The anterior gallbladder wall is thickened measuring up to 3.3 mm. Sonographic Eulah Pont sign is positive per sonographer.  No calcified gallstones are seen. Common bile duct: Diameter: 5.1 mm Liver: No focal lesion identified. Increase in parenchymal echogenicity. Portal vein is patent on color Doppler imaging with normal direction of blood flow towards the liver. Other: None. IMPRESSION: 1. Gallbladder sludge with gallbladder wall thickening and positive sonographic Murphy sign. Findings are concerning for acute cholecystitis. 2. Increased hepatic parenchymal echogenicity suggestive of steatosis. Electronically Signed   By: Darliss Cheney M.D.   On: 05/02/2023 00:47   CT Angio Abd/Pel W and/or Wo Contrast Result Date: 05/02/2023 CLINICAL DATA:  History of abdominal aortic aneurysm with stent graft therapy EXAM: CTA ABDOMEN AND PELVIS WITHOUT AND WITH CONTRAST TECHNIQUE: Multidetector CT imaging of the abdomen and pelvis was performed using the standard protocol during bolus administration of intravenous contrast. Multiplanar reconstructed images and MIPs were obtained and reviewed to evaluate the vascular anatomy. RADIATION DOSE REDUCTION: This exam was performed according to the departmental dose-optimization program which includes automated exposure control, adjustment of the mA and/or kV according to patient size and/or use of iterative reconstruction technique. CONTRAST:  OMNIPAQUE IOHEXOL 350 MG/ML SOLN COMPARISON:  04/17/2023 FINDINGS: VASCULAR Aorta: Atherosclerotic calcifications of the abdominal aorta are noted. Infrarenal abdominal aortic aneurysm is seen with aortic stent graft placement. Stent graft is widely patent. No abnormal enhancement is identified within the aneurysm sac to suggest endoleak. Aorta measures approximately 5.7 cm in greatest diameter stable in appearance from the prior exam. 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 SMA: Calcified origin of the SMA with moderate stenosis. Renals: Single renal arteries are identified bilaterally.  Atherosclerotic calcifications are seen without focal hemodynamically significant stenosis. 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 The distal IMA appears within normal limits. Inflow: Stent graft extends into the common iliac arteries bilaterally. Diffuse atherosclerotic calcifications are seen without focal hemodynamically significant stenosis. Right femoral bypass graft is noted and patent as visualized. Veins: No specific venous abnormality is noted. Review of the MIP images confirms the above findings. NON-VASCULAR Lower chest: Mild atelectatic changes in the bases bilaterally. Hepatobiliary: Liver is within normal limits. Gallbladder is well distended. Some pericholecystic inflammatory changes are noted and indistinct walls which may represent acute cholecystitis. Ultrasound would be helpful for further evaluation. Pancreas: Unremarkable. No pancreatic ductal dilatation or surrounding inflammatory changes. Spleen: Normal in size without focal abnormality. Adrenals/Urinary Tract: Adrenal glands are within normal limits. Kidneys demonstrate a normal enhancement pattern. Simple right renal cyst is again noted and stable. No follow-up is recommended. No calculi are seen. No obstructive changes are noted. The bladder is decompressed by Foley catheter. Stomach/Bowel: Scattered diverticular change of the colon is noted without evidence of diverticulitis. Fecal material is seen throughout the colon as well without obstructive  change. Appendix is within normal limits. Small bowel shows no obstructive changes. Stomach is within normal limits. Lymphatic: No lymphadenopathy is noted. Reproductive: Prostate is unremarkable. Other: No abdominal wall hernia or abnormality. No abdominopelvic ascites. Musculoskeletal: No acute or significant osseous findings. IMPRESSION: VASCULAR Patent aortic stent graft without evidence  of endoleak. 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 Patent right fem-distal bypass proximally the distal aspect of the graft is not visualized. NON-VASCULAR Pericholecystic inflammatory changes are noted suspicious for acute cholecystitis. Ultrasound may be helpful for further evaluation. Diverticulosis without diverticulitis. Bibasilar atelectasis. Electronically Signed   By: Alcide Clever M.D.   On: 05/02/2023 00:02   CT HEAD WO CONTRAST ( ) Result Date: 05/01/2023 CLINICAL DATA:  Altered mental status EXAM: CT HEAD WITHOUT CONTRAST TECHNIQUE: Contiguous axial images were obtained from the base of the skull through the vertex without intravenous contrast. RADIATION DOSE REDUCTION: This exam was performed according to the departmental dose-optimization program which includes automated exposure control, adjustment of the mA and/or kV according to patient size and/or use of iterative reconstruction technique. COMPARISON:  04/17/2023 FINDINGS: Brain: No evidence of acute infarction, hemorrhage, hydrocephalus, extra-axial collection or mass lesion/mass effect. Chronic mild atrophic changes and white matter ischemic changes are noted. Vascular: No hyperdense vessel or unexpected calcification. Skull: Normal. Negative for fracture or focal lesion. Sinuses/Orbits: No acute finding. Other: None. IMPRESSION: No acute intracranial abnormality noted. Electronically Signed   By: Alcide Clever M.D.   On: 05/01/2023 23:53   DG Chest Port 1 View Result Date: 05/01/2023 CLINICAL DATA:  Questionable sepsis. EXAM: PORTABLE CHEST 1 VIEW COMPARISON:  Portable chest 04/17/2023 FINDINGS: The heart size and mediastinal contours are within normal limits. Calcific plaques are again noted in the aortic arch. Both lungs are clear of infiltrates with linear scarring or atelectasis in the left base. The depth of  inspiration is less than previously with subsegmental atelectatic change in the right base as well. The visualized skeletal structures are unremarkable. IMPRESSION: No evidence of acute chest disease. Bibasilar subsegmental atelectasis with decreased inspiration. Aortic atherosclerosis. Electronically Signed   By: Almira Bar M.D.   On: 05/01/2023 21:42      IMPRESSION AND PLAN:  Assessment and Plan: * Acute cholecystitis - The patient be admitted to a medical telemetry bed. - Will continue antibiotic therapy with IV Zosyn. - Pain management will be provided. - Surgery consult to be obtained. - Dr. Maurine Minister was notified and is aware about the patient. - The patient will be kept NPO. - We will hold off aspirin.  Sepsis due to undetermined organism [facility]) - The patient will be placed on IV Zosyn as mentioned above. - We will continue hydration with IV ringer. - We will follow blood cultures. - Management otherwise as above.  Hypertensive urgency The patient be placed on as needed IV hydralazine and labetalol. - We will continue antihypertensive therapy.  BPH (benign prostatic hyperplasia) - We will resume Flomax.  Gout - We will continue colchicine and Uloric.     DVT prophylaxis: Lovenox.  Advanced Care Planning:  Code Status: full code.  Family Communication:  The plan of care was discussed in details with the patient (and family). I answered all questions. The patient agreed to proceed with the above mentioned plan. Further management will depend upon hospital course. Disposition Plan: Back to previous home environment Consults called: General Surgery All the  records are reviewed and case discussed with ED provider.  Status is: Inpatient    At the time of the admission, it appears that the appropriate admission status for this patient is inpatient.  This is judged to be reasonable and necessary in order to provide the required intensity of service to ensure the  patient's safety given the presenting symptoms, physical exam findings and initial radiographic and laboratory data in the context of comorbid conditions.  The patient requires inpatient status due to high intensity of service, high risk of further deterioration and high frequency of surveillance required.  I certify that at the time of admission, it is my clinical judgment that the patient will require inpatient hospital care extending more than 2 midnights.                            Dispo: The patient is from: Home              Anticipated d/c is to: Home              Patient currently is not medically stable to d/c.              Difficult to place patient: No  Hannah Beat M.D on 05/02/2023 at 4:22 AM  [facility]   From 7 PM-7 AM, contact night-coverage [email or web address]  CC: Primary care physician; Toborg, Kelby Aline, MD

## 2023-05-02 NOTE — Assessment & Plan Note (Signed)
-   We will continue colchicine and Uloric.

## 2023-05-02 NOTE — ED Notes (Signed)
RN from specials transporting pt to procedure at this time.

## 2023-05-02 NOTE — Assessment & Plan Note (Signed)
The patient be placed on as needed IV hydralazine and labetalol. - We will continue antihypertensive therapy.

## 2023-05-03 DIAGNOSIS — K819 Cholecystitis, unspecified: Secondary | ICD-10-CM | POA: Diagnosis not present

## 2023-05-03 DIAGNOSIS — E871 Hypo-osmolality and hyponatremia: Secondary | ICD-10-CM | POA: Diagnosis not present

## 2023-05-03 DIAGNOSIS — N1832 Chronic kidney disease, stage 3b: Secondary | ICD-10-CM | POA: Diagnosis not present

## 2023-05-03 DIAGNOSIS — K810 Acute cholecystitis: Secondary | ICD-10-CM | POA: Diagnosis not present

## 2023-05-03 LAB — BASIC METABOLIC PANEL
Anion gap: 9 (ref 5–15)
BUN: 33 mg/dL — ABNORMAL HIGH (ref 8–23)
CO2: 21 mmol/L — ABNORMAL LOW (ref 22–32)
Calcium: 8.6 mg/dL — ABNORMAL LOW (ref 8.9–10.3)
Chloride: 101 mmol/L (ref 98–111)
Creatinine, Ser: 1.63 mg/dL — ABNORMAL HIGH (ref 0.61–1.24)
GFR, Estimated: 40 mL/min — ABNORMAL LOW (ref >60)
Glucose, Bld: 108 mg/dL — ABNORMAL HIGH (ref 70–99)
Potassium: 4.1 mmol/L (ref 3.5–5.1)
Sodium: 131 mmol/L — ABNORMAL LOW (ref 135–145)

## 2023-05-03 LAB — CBC
HCT: 31.9 % — ABNORMAL LOW (ref 39.0–52.0)
Hemoglobin: 11.3 g/dL — ABNORMAL LOW (ref 13.0–17.0)
MCH: 29.6 pg (ref 26.0–34.0)
MCHC: 35.4 g/dL (ref 30.0–36.0)
MCV: 83.5 fL (ref 80.0–100.0)
Platelets: 367 10*3/uL (ref 150–400)
RBC: 3.82 MIL/uL — ABNORMAL LOW (ref 4.22–5.81)
RDW: 12.6 % (ref 11.5–15.5)
WBC: 28.2 10*3/uL — ABNORMAL HIGH (ref 4.0–10.5)
nRBC: 0 % (ref 0.0–0.2)

## 2023-05-03 LAB — MAGNESIUM: Magnesium: 1.8 mg/dL (ref 1.7–2.4)

## 2023-05-03 MED ORDER — SODIUM CHLORIDE 0.9% FLUSH
5.0000 mL | Freq: Three times a day (TID) | INTRAVENOUS | Status: DC
Start: 2023-05-03 — End: 2023-05-04
  Administered 2023-05-03 – 2023-05-04 (×3): 5 mL

## 2023-05-03 MED ORDER — SODIUM CHLORIDE 0.9 % IV SOLN: INTRAVENOUS | Status: DC

## 2023-05-03 NOTE — Progress Notes (Signed)
Referring Physician(s): Dr. Maurine Minister  Supervising Physician: Malachy Moan  Patient Status:  [facility] - In-pt  Chief Complaint: Acute cholecystitis s/p percutaneous cholecystostomy 05/02/23  Subjective: Patient resting comfortably in bed. Appears pleasantly confused. His wife is at the bedside. No questions/concerns regarding the drain.   Allergies: Meat extract, Rosuvastatin, Allopurinol, Propofol, and Shellfish allergy  Medications: Prior to Admission medications   Medication Sig Start Date End Date Taking? Authorizing Provider  Apoaequorin 10 MG CAPS Take 1 tablet by mouth daily.   Yes [provider]  ascorbic acid (VITAMIN C) 1000 MG tablet Take 1,000 mg by mouth daily. 12/10/17  Yes [provider]  aspirin EC 81 MG tablet Take 81 mg by mouth daily. Swallow whole.   Yes [provider]  colchicine 0.6 MG tablet Take 0.5 tablets (0.3 mg total) by mouth daily. 04/22/23  Yes Loyce Dys, MD  febuxostat (ULORIC) 40 MG tablet Take 40 mg by mouth daily.   Yes [provider]  fluticasone (FLONASE) 50 MCG/ACT nasal spray Place 2 sprays into both nostrils daily as needed for rhinitis or allergies. 07/15/18  Yes [provider]  lisinopril (ZESTRIL) 20 MG tablet Take 20 mg by mouth daily.   Yes [provider]  polyethylene glycol (MIRALAX) 17 g packet Take 17 g by mouth daily. 04/18/23  Yes Osvaldo Shipper, MD  PRALUENT 75 MG/ML SOAJ Inject 75 mg into the skin as directed. 75 mg under skin every 14 days   Yes [provider]  senna-docusate (SENOKOT-S) 8.6-50 MG tablet Take 2 tablets by mouth at bedtime. 04/18/23  Yes Osvaldo Shipper, MD  tamsulosin (FLOMAX) 0.4 MG CAPS capsule Take 1 capsule (0.4 mg total) by mouth daily. 04/22/23  Yes Loyce Dys, MD     Vital Signs: BP (!) 136/43 (BP Location: Left Arm)   Pulse 86   Temp 97.8 F (36.6 C) (Oral)   Resp 18   Ht 5\' 11"  (1.803 m)   Wt 173 lb 8 oz (78.7 kg)   SpO2 96%    BMI 24.20 kg/m   Physical Exam Constitutional:      General: He is not in acute distress.    Appearance: He is not ill-appearing.  Pulmonary:     Effort: Pulmonary effort is normal.  Abdominal:     Palpations: Abdomen is soft.     Tenderness: There is no abdominal tenderness.     Comments: RUQ drain to gravity. Approximately 15 ml of serosanguineous fluid in bulb. Dressing is clean/dry/intact.   Skin:    General: Skin is warm and dry.  Neurological:     Mental Status: He is alert. He is disoriented.     Imaging: IR Perc Cholecystostomy Result Date: 05/02/2023 CLINICAL DATA:  Pericholecystic inflammatory changes, wall thickening, positive sonographic Murphy sign consistent with acute cholecystitis. Percutaneous drainage requested EXAM: PERCUTANEOUS CHOLECYSTOSTOMY TUBE PLACEMENT WITH ULTRASOUND AND FLUOROSCOPIC GUIDANCE FLUOROSCOPY: Radiation Exposure Index (as provided by the fluoroscopic device): 4 mGy air Kerma TECHNIQUE: The procedure, risks (including but not limited to bleeding, infection, organ damage ), benefits, and alternatives were explained to the patient and spouse. Questions regarding the procedure were encouraged and answered. The spouse understands and consents to the procedure. Survey ultrasound of the abdomen was performed and an appropriate skin entry site was identified. Skin site was marked, prepped with Betadine, and draped in usual sterile fashion, and infiltrated locally with 1% lidocaine. Intravenous Fentanyl and Versed 1mg  were administered by RN during a  total moderate (conscious) sedation time of 10 minutes; the patient's level of consciousness and physiological / cardiorespiratory status were monitored continuously by radiology RN under my direct supervision. Under real-time ultrasound guidance, gallbladder was accessed using a transhepatic approach with a 21-gauge needle. Ultrasound image documentation was saved. Bile returned through the hub. Needle was  exchanged over a 018 guidewire for transitional dilator which allowed placement of 035 J wire. Over this, a 10.2 French pigtail catheter was advanced and formed centrally in the gallbladder lumen. Small contrast injection confirmed appropriate position. Catheter secured externally with 0 Prolene suture and placed external drain bag. Patient tolerated the procedure well. COMPLICATIONS: COMPLICATIONS none IMPRESSION: 1. Technically successful percutaneous cholecystostomy tube placement with ultrasound and fluoroscopic guidance. Electronically Signed   By: Corlis Leak M.D.   On: 05/02/2023 21:07   US ABDOMEN LIMITED RUQ (LIVER/GB) Result Date: 05/02/2023 CLINICAL DATA:  Abdominal pain.  Possible cholecystitis on CT. EXAM: ULTRASOUND ABDOMEN LIMITED RIGHT UPPER QUADRANT COMPARISON:  CTA abdomen and pelvis 05/01/2023 FINDINGS: Gallbladder: Gallbladder sludge is present. The anterior gallbladder wall is thickened measuring up to 3.3 mm. Sonographic Eulah Pont sign is positive per sonographer. No calcified gallstones are seen. Common bile duct: Diameter: 5.1 mm Liver: No focal lesion identified. Increase in parenchymal echogenicity. Portal vein is patent on color Doppler imaging with normal direction of blood flow towards the liver. Other: None. IMPRESSION: 1. Gallbladder sludge with gallbladder wall thickening and positive sonographic Murphy sign. Findings are concerning for acute cholecystitis. 2. Increased hepatic parenchymal echogenicity suggestive of steatosis. Electronically Signed   By: Darliss Cheney M.D.   On: 05/02/2023 00:47   CT Angio Abd/Pel W and/or Wo Contrast Result Date: 05/02/2023 CLINICAL DATA:  History of abdominal aortic aneurysm with stent graft therapy EXAM: CTA ABDOMEN AND PELVIS WITHOUT AND WITH CONTRAST TECHNIQUE: Multidetector CT imaging of the abdomen and pelvis was performed using the standard protocol during bolus administration of intravenous contrast. Multiplanar reconstructed images and  MIPs were obtained and reviewed to evaluate the vascular anatomy. RADIATION DOSE REDUCTION: This exam was performed according to the departmental dose-optimization program which includes automated exposure control, adjustment of the mA and/or kV according to patient size and/or use of iterative reconstruction technique. CONTRAST:  OMNIPAQUE IOHEXOL 350 MG/ML SOLN COMPARISON:  04/17/2023 FINDINGS: VASCULAR Aorta: Atherosclerotic calcifications of the abdominal aorta are noted. Infrarenal abdominal aortic aneurysm is seen with aortic stent graft placement. Stent graft is widely patent. No abnormal enhancement is identified within the aneurysm sac to suggest endoleak. Aorta measures approximately 5.7 cm in greatest diameter stable in appearance from the prior exam. 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 SMA: Calcified origin of the SMA with moderate stenosis. Renals: Single renal arteries are identified bilaterally. Atherosclerotic calcifications are seen without focal hemodynamically significant stenosis. 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 The distal IMA appears within normal limits. Inflow: Stent graft extends into the common iliac arteries bilaterally. Diffuse atherosclerotic calcifications are seen without focal hemodynamically significant stenosis. Right femoral bypass graft is noted and patent as visualized. Veins: No specific venous abnormality is noted. Review of the MIP images confirms the above findings. NON-VASCULAR Lower chest: Mild atelectatic changes in the bases bilaterally. Hepatobiliary: Liver is within normal limits. Gallbladder is well distended. Some pericholecystic inflammatory changes are noted and indistinct walls which may represent acute cholecystitis. Ultrasound would be helpful  for further evaluation. Pancreas:  Unremarkable. No pancreatic ductal dilatation or surrounding inflammatory changes. Spleen: Normal in size without focal abnormality. Adrenals/Urinary Tract: Adrenal glands are within normal limits. Kidneys demonstrate a normal enhancement pattern. Simple right renal cyst is again noted and stable. No follow-up is recommended. No calculi are seen. No obstructive changes are noted. The bladder is decompressed by Foley catheter. Stomach/Bowel: Scattered diverticular change of the colon is noted without evidence of diverticulitis. Fecal material is seen throughout the colon as well without obstructive change. Appendix is within normal limits. Small bowel shows no obstructive changes. Stomach is within normal limits. Lymphatic: No lymphadenopathy is noted. Reproductive: Prostate is unremarkable. Other: No abdominal wall hernia or abnormality. No abdominopelvic ascites. Musculoskeletal: No acute or significant osseous findings. IMPRESSION: VASCULAR Patent aortic stent graft without evidence of endoleak. 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 Patent right fem-distal bypass proximally the distal aspect of the graft is not visualized. NON-VASCULAR Pericholecystic inflammatory changes are noted suspicious for acute cholecystitis. Ultrasound may be helpful for further evaluation. Diverticulosis without diverticulitis. Bibasilar atelectasis. Electronically Signed   By: Alcide Clever M.D.   On: 05/02/2023 00:02   CT HEAD WO CONTRAST ( ) Result Date: 05/01/2023 CLINICAL DATA:  Altered mental status EXAM: CT HEAD WITHOUT CONTRAST TECHNIQUE: Contiguous axial images were obtained from the base of the skull through the vertex without intravenous contrast. RADIATION DOSE REDUCTION: This exam was performed according to the departmental dose-optimization program which includes automated exposure control,  adjustment of the mA and/or kV according to patient size and/or use of iterative reconstruction technique. COMPARISON:  04/17/2023 FINDINGS: Brain: No evidence of acute infarction, hemorrhage, hydrocephalus, extra-axial collection or mass lesion/mass effect. Chronic mild atrophic changes and white matter ischemic changes are noted. Vascular: No hyperdense vessel or unexpected calcification. Skull: Normal. Negative for fracture or focal lesion. Sinuses/Orbits: No acute finding. Other: None. IMPRESSION: No acute intracranial abnormality noted. Electronically Signed   By: Alcide Clever M.D.   On: 05/01/2023 23:53   DG Chest Port 1 View Result Date: 05/01/2023 CLINICAL DATA:  Questionable sepsis. EXAM: PORTABLE CHEST 1 VIEW COMPARISON:  Portable chest 04/17/2023 FINDINGS: The heart size and mediastinal contours are within normal limits. Calcific plaques are again noted in the aortic arch. Both lungs are clear of infiltrates with linear scarring or atelectasis in the left base. The depth of inspiration is less than previously with subsegmental atelectatic change in the right base as well. The visualized skeletal structures are unremarkable. IMPRESSION: No evidence of acute chest disease. Bibasilar subsegmental atelectasis with decreased inspiration. Aortic atherosclerosis. Electronically Signed   By: Almira Bar M.D.   On: 05/01/2023 21:42    Labs:  CBC: Recent Labs    04/22/23 0508 05/01/23 2137 05/02/23 0411 05/03/23 0536  WBC 7.4 32.0* 31.7* 28.2*  HGB 12.6* 15.5 13.8 11.3*  HCT 36.4* 44.2 39.1 31.9*  PLT 244 419* 370 367    COAGS: Recent Labs    04/17/23 1431 05/01/23 2137 05/02/23 0411  INR 1.0 1.1 1.3*  APTT  --  22*  --     BMP: Recent Labs    04/22/23 0508 05/01/23 2137 05/02/23 0411 05/03/23 0536  NA 136 131* 129* 131*  K 3.8 4.8 4.5 4.1  CL 106 95* 97* 101  CO2 21* 20* 22 21*  GLUCOSE 108* 173* 155* 108*  BUN 33* 26* 24* 33*  CALCIUM 8.8* 9.7 9.0 8.6*  CREATININE  1.71* 1.36* 1.29* 1.63*  GFRNONAA 38* 50* 53* 40*    LIVER FUNCTION TESTS: Recent Labs    04/17/23 1431 05/01/23 2137 05/02/23 0411  BILITOT 0.9 1.3* 1.1  AST 27 21 18   ALT 21 18 15   ALKPHOS 85 71 62  PROT 8.2* 8.1 6.8  ALBUMIN 4.8 4.1 3.5    Assessment and Plan:  Acute cholecystitis s/p percutaneous cholecystostomy 05/02/23  Patient is afebrile with down trending leukocytosis. Patient denies pain or discomfort.   Drain Location: RUQ Size: Fr size: 10 Fr Date of placement: 05/02/23  Currently to: Drain collection device: gravity 24 hour output:  Output by Drain (mL) 05/01/23 0700 - 05/01/23 1459 05/01/23 1500 - 05/01/23 2259 05/01/23 2300 - 05/02/23 0659 05/02/23 0700 - 05/02/23 1459 05/02/23 1500 - 05/02/23 2259 05/02/23 2300 - 05/03/23 0659 05/03/23 0700 - 05/03/23 1324  Biliary Tube Cholecystostomy 10 Fr. RUQ     80  60    Interval imaging/drain manipulation:  None  Current examination: Flushes/aspirates easily.  Insertion site unremarkable. Suture and stat lock in place. Dressed appropriately.   Plan: Continue TID flushes with 5 cc NS. Record output Q shift. Dressing changes QD or PRN if soiled.  Call IR APP or on call IR MD if difficulty flushing or sudden change in drain output.  Repeat imaging/possible drain injection once output < 10 mL/QD (excluding flush material). Consideration for drain removal if output is < 10 mL/QD (excluding flush material), pending discussion with the providing surgical service.  Discharge planning: Percutaneous cholecystostomy drain to remain in place at least 6 weeks. Recommend fluoroscopy with injection of the drain in IR to evaluate for patency of the cystic duct. If the duct is patent and general surgery feels patient is stable for cholecystectomy, the drain would be removed at time of surgery. If the duct is patent and general surgery feels patient is NEVER a candidate for cholecystectomy, drain can be capped for a trial. If  symptoms recur, then place to gravity bag again. If trial is successful, discuss possible removal of the drain.  IR will continue to follow - please call with questions or concerns.  Electronically Signed: Alwyn Ren, AGACNP-BC 05/03/2023, 9:19 AM   I spent a total of 15 Minutes at the the patient's bedside AND on the patient's [facility] floor or unit, greater than 50% of which was counseling/coordinating care for percutaneous cholecystostomy

## 2023-05-03 NOTE — Progress Notes (Signed)
[facility] SURGICAL PROGRESS NOTE (cpt [phone])  Hospital Day(s): 1.   Interval History: Patient seen and examined, no acute events or new complaints overnight. Patient still very confused; disoriented to place and time. Wife at bedside helps contribute to history. Still with abdominal pain. No fever. His leukocytosis is now improving; WBC 28.2K. Hgb to 11.3. Renal function stable; sCr - 1.63; UO - 850 ccs. Improved hyponatremia to 131. He did get percutaneous cholecystotomy tube yesterday; output 80 ccs and this has sanguinous and bilious output. Cx without growth. He is on Zosyn. NPO  Review of Systems:  Unable to reliably perform secondary to AMS + Abdominal pain   Vital signs in last 24 hours: [min-max] current  Temp:  [97.7 F (36.5 C)-98.8 F (37.1 C)] 98.6 F (37 C) (01/27 0703) Pulse Rate:  [84-102] 86 (01/27 0703) Resp:  [12-29] 20 (01/27 0703) BP: (98-188)/(41-128) 120/41 (01/27 0703) SpO2:  [15 %-100 %] 98 % (01/27 0703)     Height: 5\' 11"  (180.3 cm) Weight: 78.7 kg BMI (Calculated): 24.21   Intake/Output last 2 shifts:  01/26 0701 - 01/27 0700 In: 991.5 [I.V.:852.1; IV Piggyback:139.3] Out: 930 [Urine:850; Drains:80]   Physical Exam:  Constitutional: alert, cooperative, disoriented to place and time; wife at bedside  HENT: normocephalic without obvious abnormality  Eyes: PERRL, EOM's grossly intact and symmetric  Respiratory: breathing non-labored at rest  Cardiovascular: regular rate and sinus rhythm  Gastrointestinal: soft, RUQ tenderness, and non-distended. Cholecystostomy tube in RUQ; output bilious and sanguinous  Musculoskeletal: no edema or wounds, motor and sensation grossly intact, NT    Labs:     Latest Ref Rng & Units 05/03/2023    5:36 AM 05/02/2023    4:11 AM 05/01/2023    9:37 PM  CBC  WBC 4.0 - 10.5 K/uL 28.2  31.7  32.0   Hemoglobin 13.0 - 17.0 g/dL 95.6  38.7  56.4   Hematocrit 39.0 - 52.0 % 31.9  39.1  44.2   Platelets 150 - 400  K/uL 367  370  419       Latest Ref Rng & Units 05/03/2023    5:36 AM 05/02/2023    4:11 AM 05/01/2023    9:37 PM  CMP  Glucose 70 - 99 mg/dL 332  951  884   BUN 8 - 23 mg/dL 33  24  26   Creatinine 0.61 - 1.24 mg/dL 1.66  0.63  0.16   Sodium 135 - 145 mmol/L 131  129  131   Potassium 3.5 - 5.1 mmol/L 4.1  4.5  4.8   Chloride 98 - 111 mmol/L 101  97  95   CO2 22 - 32 mmol/L 21  22  20    Calcium 8.9 - 10.3 mg/dL 8.6  9.0  9.7   Total Protein 6.5 - 8.1 g/dL  6.8  8.1   Total Bilirubin 0.0 - 1.2 mg/dL  1.1  1.3   Alkaline Phos 38 - 126 U/L  62  71   AST 15 - 41 U/L  18  21   ALT 0 - 44 U/L  15  18      Imaging studies: No new pertinent imaging studies   Assessment/Plan: (ICD-10's: K81.0) 89 y.o. male with acute cholecystitis s/p percutaneous cholecystostomy tube placement (01/26), complicated by dementia/AMS   - He is making some improvement at least with leukocytosis although this remains significant and still with abdominal pain. For now, would continue NPO; okay for sips of water/juice  and medications. Would hold on formal diet until pain improves further - Appreciate IR assistance with percutaneous cholecystostomy tube. Again reviewed with his wife that he will need to maintain this for at least 6-8 weeks. Record drainage daily. Suspect more sanguinous drainage today is sequela of his severe cholecystitis   - Monitor abdominal examination  - Monitor leukocytosis; improving - morning CBC - Monitor renal function   - Pain control prn; antiemetics prn   - Mobilize as feasible; up to chair at minimum   - Further management per primary service; we will follow   All of the above findings and recommendations were discussed with the patient, patient's family (wife at bedside), and the medical team, and all of patient's wife's questions were answered to her expressed satisfaction.  -- Lynden Oxford, PA-C [facility] 05/03/2023, 7:28 AM M-F: 7am - 4pm

## 2023-05-03 NOTE — Plan of Care (Signed)
Problem: Nutrition: Goal: Adequate nutrition will be maintained Outcome: Progressing   Problem: Coping: Goal: Level of anxiety will decrease Outcome: Progressing   Problem: Pain Managment: Goal: General experience of comfort will improve and/or be controlled Outcome: Progressing   Problem: Safety: Goal: Ability to remain free from injury will improve Outcome: Progressing

## 2023-05-03 NOTE — TOC Initial Note (Signed)
Transition of Care [facility]) - Initial/Assessment Note    Patient Details  Name: Leanord Thibeau MRN: [id number] Date of Birth: [birth year]  Transition [facility]) CM/SW Contact:    Margarito Liner, LCSW Phone Number: 05/03/2023, 12:59 PM  Clinical Narrative:   Readmission prevention screen complete. Patient only oriented to self and asleep. Wife at bedside. CSW introduced role and explained that discharge planning would be discussed. PCP is Jessee Avers, MD in [place] where they recently moved from. Wife is interested in finding a PCP that is closer to [place]. CSW will add list to AVS. He uses the [facility]. No issues obtaining medications. Patient lives home with his wife. His sister-in-law is in the home assisting as well but is not living with them. No home health prior to admission but wife would like to have him set up prior to discharge. PT/OT evaluations are pending. Patient has a RW and elevated toilet seat at home. Per chart review, patient went to [facility] on 1/16 but left that same day. No further concerns. CSW will continue to follow patient and his wife for support and facilitate return home once stable.              Expected Discharge Plan: Home w Home Health Services Barriers to Discharge: Continued Medical Work up   Patient Goals and CMS Choice            Expected Discharge Plan and Services     Post Acute Care Choice: Home Health Living arrangements for the past 2 months: Single Family Home                                      Prior Living Arrangements/Services Living arrangements for the past 2 months: Single Family Home Lives with:: Spouse Patient language and need for interpreter reviewed:: Yes Do you feel safe going back to the place where you live?: Yes      Need for Family Participation in Patient Care: Yes (Comment) Care giver support system in place?: Yes (comment) Current home services: DME Criminal Activity/Legal  Involvement Pertinent to Current Situation/Hospitalization: No - Comment as needed  Activities of Daily Living   ADL Screening (condition at time of admission) Independently performs ADLs?: No Does the patient have a NEW difficulty with bathing/dressing/toileting/self-feeding that is expected to last >3 days?: No Does the patient have a NEW difficulty with getting in/out of bed, walking, or climbing stairs that is expected to last >3 days?: No Does the patient have a NEW difficulty with communication that is expected to last >3 days?: No Is the patient deaf or have difficulty hearing?: No Does the patient have difficulty seeing, even when wearing glasses/contacts?: No Does the patient have difficulty concentrating, remembering, or making decisions?: Yes  Permission Sought/Granted Permission sought to share information with : Family Supports    Share Information with NAME: Ismeal Heider     Permission granted to share info w Relationship: Wife  Permission granted to share info w Contact Information: [phone]  Emotional Assessment Appearance:: Appears stated age Attitude/Demeanor/Rapport: Unable to Assess Affect (typically observed): Unable to Assess Orientation: : Oriented to Self Alcohol / Substance Use: Not Applicable Psych Involvement: No (comment)  Admission diagnosis:  Acute cholecystitis [K81.0] Cholecystitis [K81.9] Altered mental status, unspecified altered mental status type [R41.82] Sepsis, due to unspecified organism, unspecified whether acute organ dysfunction present [facility]) [A41.9] Patient Active  Problem List   Diagnosis Date Noted   Cholecystitis 05/02/2023   Sepsis due to undetermined organism (HCC) 05/02/2023   Hypertensive urgency 05/02/2023   Gout 05/02/2023   BPH (benign prostatic hyperplasia) 05/02/2023   Hyponatremia 05/02/2023   AMS (altered mental status) 04/18/2023   Altered mental status 04/17/2023   Essential hypertension 04/17/2023   CKD stage  3b, GFR 30-44 ml/min (HCC) 04/17/2023   Dementia (HCC) 04/17/2023   Abdominal aortic aneurysm (AAA) (HCC) 04/17/2023   PVD (peripheral vascular disease) (HCC) 04/17/2023   Constipation 04/17/2023   Leukocytosis 04/17/2023   PCP:  Langley Gauss, MD Pharmacy:   [facility] [address] [facility] - [place], [place] - [address] [facility] [address] [facility] [address] [place] [address] Phone: [phone] Fax: [phone]     Social Drivers of Health (SDOH) Social History: SDOH Screenings   Food Insecurity: Patient Unable To Answer (05/02/2023)  Housing: Patient Unable To Answer (05/02/2023)  Transportation Needs: No Transportation Needs (04/20/2023)  Utilities: Not At Risk (04/20/2023)  Financial Resource Strain: Low Risk  (03/08/2022)   Received from [facility]  Physical Activity: Unknown (03/08/2022)   Received from [facility]  Social Connections: Patient Unable To Answer (05/02/2023)  Recent Concern: Social Connections - Moderately Isolated (04/20/2023)  Stress: No Stress Concern Present (03/08/2022)   Received from [facility]  Tobacco Use: Low Risk  (05/02/2023)   SDOH Interventions:     Readmission Risk Interventions    05/03/2023   12:57 PM  Readmission Risk Prevention Plan  Transportation Screening Complete  PCP or Specialist Appt within 3-5 Days Complete  Social Work Consult for Recovery Care Planning/Counseling Complete  Palliative Care Screening Not Applicable  Medication Review Oceanographer) Complete

## 2023-05-03 NOTE — Evaluation (Signed)
Occupational Therapy Evaluation Patient Details Name: Adrian Aguilar MRN: [id number] DOB: June 10, 1932 Today's Date: 05/03/2023   History of Present Illness 89 year old male who presents with acute cholecystitis s/p percutaneous cholecystostomy tube placement (01/26), complicated by dementia/AMS. PMHx: CKD 3B, PAD s/p aortic bifurcation stenting, hypertension   Clinical Impression   Pt was seen for OT evaluation this date. Prior to hospital admission, pt was living at home with his wife. He was recently hospitalized and went to SNF for less than a day and returned home. Wife reports he had been doing his ADLs independently and was walking with RW until he began throwing up.  Pt presents to acute OT demonstrating impaired ADL performance and functional mobility 2/2 weakness, balance deficits, pain and low activity tolerance (See OT problem list for additional functional deficits). Pt unable to give a number, but was groaning and grimacing stating "I feel like crap" during the session. Increased encouragement from therapist and family to participate. Pt currently requires Mod A to roll with cueing for log roll technique for comfort and Mod A for sidelying to sit. Difficulty following commands. Max cueing for hand/feet placement, what to do. STS from EOB x2 with Mod A to RW then able to lateral step and walk ~5 feet to the recliner with RW and Min A. Pt fatigued at end of session. Wife states they will not go back to any rehab facility and will be returning home.  Pt would benefit from skilled OT services to address noted impairments and functional limitations (see below for any additional details) in order to maximize safety and independence while minimizing falls risk and caregiver burden. Do anticipate the need for follow up OT services upon acute hospital DC.        If plan is discharge home, recommend the following: A little help with walking and/or transfers;A lot of help with  bathing/dressing/bathroom;Assistance with cooking/housework;Direct supervision/assist for financial management;Supervision due to cognitive status;Assist for transportation;Help with stairs or ramp for entrance    Functional Status Assessment  Patient has had a recent decline in their functional status and demonstrates the ability to make significant improvements in function in a reasonable and predictable amount of time.  Equipment Recommendations  BSC/3in1    Recommendations for Other Services       Precautions / Restrictions Precautions Precautions: Fall Restrictions Weight Bearing Restrictions Per Provider Order: No      Mobility Bed Mobility Overal bed mobility: Needs Assistance Bed Mobility: Sidelying to Sit, Rolling Rolling: Mod assist, Used rails Sidelying to sit: Mod assist, Used rails, HOB elevated       General bed mobility comments: extra time, effort and cueing required for hand placement    Transfers Overall transfer level: Needs assistance Equipment used: Rolling walker (2 wheels) Transfers: Sit to/from Stand Sit to Stand: Mod assist           General transfer comment: Mod A to perform STS from lowest bed height x2 trials with max cueing for pushing up from bed; able to take lateral steps to [facility] then ambulate ~5 feet to recliner      Balance Overall balance assessment: Needs assistance, History of Falls   Sitting balance-Leahy Scale: Good Sitting balance - Comments: able to achieve balance with increased time Postural control: Posterior lean, Right lateral lean Standing balance support: Bilateral upper extremity supported, During functional activity, Reliant on assistive device for balance Standing balance-Leahy Scale: Poor Standing balance comment: Min A and RW use to ambulate and maintain balance  ADL either performed or assessed with clinical judgement   ADL Overall ADL's : Needs assistance/impaired                          Toilet Transfer: Minimal assistance;Rolling walker (2 wheels) Toilet Transfer Details (indicate cue type and reason): simualted to recliner                 Vision         Perception         Praxis         Pertinent Vitals/Pain Pain Assessment Pain Assessment: Faces Faces Pain Scale: Hurts even more Pain Location: abdomen Pain Intervention(s): Monitored during session, Repositioned     Extremity/Trunk Assessment Upper Extremity Assessment Upper Extremity Assessment: Generalized weakness   Lower Extremity Assessment Lower Extremity Assessment: Generalized weakness       Communication Communication Communication: Hearing impairment;Difficulty following commands/understanding Following commands: Follows one step commands inconsistently;Follows one step commands with increased time Cueing Techniques: Verbal cues;Gestural cues;Tactile cues;Visual cues   Cognition Arousal: Alert Behavior During Therapy: WFL for tasks assessed/performed Overall Cognitive Status: History of cognitive impairments - at baseline                                 General Comments: oriented to person only today; reporst he did not know he was in the hospital     General Comments  tube intact pre/post session as well as IV    Exercises Other Exercises Other Exercises: Edu on role of OT in acute setting.   Shoulder Instructions      Home Living Family/patient expects to be discharged to:: Private residence Living Arrangements: Spouse/significant other;Non-relatives/Friends (sister-in-law) Available Help at Discharge: Family;Available 24 hours/day Type of Home: House Home Access: Stairs to enter [employer] of Steps: 2 Entrance Stairs-Rails: None Home Layout: Two level;Able to live on main level with bedroom/bathroom Alternate Level Stairs-Number of Steps: 13 Alternate Level Stairs-Rails: Left;Right;Can reach both Bathroom  Shower/Tub: Producer, television/film/video: Standard     Home Equipment: None   Additional Comments: Spouse present and assisted with history secondary to pt being a poor historian      Prior Functioning/Environment Prior Level of Function : Needs assist             Mobility Comments: Ind amb without an AD community distances, 1-2 falls in the last 6 months with recent fall secondary to weakness and legs "giving out" ADLs Comments: Pt Ind with bathing and dressing with spouse assisting with meals, meds, and transportation        OT Problem List: Decreased strength;Impaired balance (sitting and/or standing);Decreased safety awareness;Decreased cognition;Pain      OT Treatment/Interventions: Self-care/ADL training;Therapeutic exercise;Patient/family education;Balance training;Therapeutic activities;DME and/or AE instruction    OT Goals(Current goals can be found in the care plan section) Acute Rehab OT Goals Patient Stated Goal: improve cognition OT Goal Formulation: With patient/family Time For Goal Achievement: 05/17/23 Potential to Achieve Goals: Fair ADL Goals Pt Will Perform Grooming: with set-up;sitting Pt Will Perform Upper Body Bathing: sitting;with set-up Pt Will Perform Lower Body Bathing: with contact guard assist;sitting/lateral leans;sit to/from stand Pt Will Perform Lower Body Dressing: with contact guard assist;sit to/from stand;sitting/lateral leans Pt Will Transfer to Toilet: with contact guard assist;ambulating;regular height toilet;grab bars;with supervision Pt Will Perform Toileting - Clothing Manipulation and hygiene: with contact guard assist;sit to/from stand;sitting/lateral leans  OT Frequency: Min 1X/week    Co-evaluation              AM-PAC OT "6 Clicks" Daily Activity     Outcome Measure Help from another person eating meals?: A Little Help from another person taking care of personal grooming?: A Little Help from another person  toileting, which includes using toliet, bedpan, or urinal?: A Lot Help from another person bathing (including washing, rinsing, drying)?: A Lot Help from another person to put on and taking off regular upper body clothing?: A Little Help from another person to put on and taking off regular lower body clothing?: A Lot 6 Click Score: 15   End of Session Equipment Utilized During Treatment: Rolling walker (2 wheels) Nurse Communication: Mobility status  Activity Tolerance: Patient tolerated treatment well Patient left: in chair;with call bell/phone within reach;with family/visitor present  OT Visit Diagnosis: Other abnormalities of gait and mobility (R26.89);Unsteadiness on feet (R26.81);Muscle weakness (generalized) (M62.81)                Time: 1610-9604 OT Time Calculation (min): 29 min Charges:  OT General Charges $OT Visit: 1 Visit OT Evaluation $OT Eval Moderate Complexity: 1 Mod OT Treatments $Therapeutic Activity: 8-22 mins Alen Matheson, OTR/L 05/03/23, 4:19 PM  Massie Cogliano E Deondra Labrador 05/03/2023, 4:15 PM

## 2023-05-03 NOTE — Progress Notes (Signed)
  Progress Note   Patient: Adrian Aguilar WUJ:[id number] DOB: [birth year] DOA: 05/01/2023     1 DOS: the patient was seen and examined on 05/03/2023   Brief hospital course: Adrian Aguilar is a 89 y.o. Caucasian male with medical history significant for essential hypertension and stage III chronic kidney disease, peripheral arterial disease, abdominal aortic aneurysm, and manage who presented to the emergency room with acute onset of abdominal pain and nausea vomiting.  Patient also was very confused before admission. Heart rate up to 100, respirate up to 31, severe leukocytosis of 32.  Ultrasound showed acute cholecystitis with sludge. Cholecystostomy performed on 1/26.  Patient also placed on Zosyn and IV fluids.   Principal Problem:   Cholecystitis Active Problems:   Sepsis due to undetermined organism (HCC)   Hypertensive urgency   Essential hypertension   CKD stage 3b, GFR 30-44 ml/min (HCC)   Dementia (HCC)   Gout   BPH (benign prostatic hyperplasia)   Hyponatremia   Assessment and Plan: *Acute cholecystitis with gallbladder sludge. Patient met sepsis criteria as above.  Ultrasound showed evidence of acute cholecystitis with gallbladder sludge. Status post cholecystostomy performed on 1/26.  Abdominal pain seems to be better today. Continue antibiotics with Zosyn.  Continue IV fluids, started Dilaudid for pain control. Patient does not have any nausea vomiting, started liquid diet.   Chronic kidney disease stage IIIb. Hyponatremia. Minimal metabolic acidosis. Sodium level dropped down to 129, fluids changed to normal saline.  Then 131 today, renal function slightly worsened today.  Continue IV fluids.     Hypertensive urgency The patient be placed on as needed IV hydralazine and labetalol.     BPH (benign prostatic hyperplasia) with urinary retention. On Flomax, also has chronic Foley catheter.  Will follow.   Gout - We will continue colchicine and  Uloric.      Subjective:  Abdominal pain is better.  Back pain also improved.  No nausea vomiting.  Physical Exam: Vitals:   05/02/23 1711 05/02/23 1713 05/03/23 0703 05/03/23 0815  BP: (!) 149/63  (!) 120/41 (!) 136/43  Pulse: 99  86 86  Resp: (!) 22  20 18   Temp:  98.2 F (36.8 C) 98.6 F (37 C) 97.8 F (36.6 C)  TempSrc:  Axillary Oral Oral  SpO2: 97%  98% 96%  Weight:      Height:       General exam: Appears calm and comfortable  Respiratory system: Clear to auscultation. Respiratory effort normal. Cardiovascular system: S1 & S2 heard, RRR. No JVD, murmurs, rubs, gallops or clicks. No pedal edema. Gastrointestinal system: Abdomen is nondistended, soft and nontender. No organomegaly or masses felt. Normal bowel sounds heard. Central nervous system: Alert and oriented x2. No focal neurological deficits. Extremities: Symmetric 5 x 5 power. Skin: No rashes, lesions or ulcers Psychiatry:Mood & affect appropriate.    Data Reviewed:  Lab results reviewed.  Family Communication: Wife updated at bedside.  Disposition: Status is: Inpatient Remains inpatient appropriate because: Severity of disease, IV treatment.     Time spent: 35 minutes  Author: Marrion Coy, MD 05/03/2023 12:40 PM  For on call review [email or web address].

## 2023-05-04 DIAGNOSIS — N1832 Chronic kidney disease, stage 3b: Secondary | ICD-10-CM | POA: Diagnosis not present

## 2023-05-04 DIAGNOSIS — A419 Sepsis, unspecified organism: Secondary | ICD-10-CM | POA: Diagnosis not present

## 2023-05-04 DIAGNOSIS — K819 Cholecystitis, unspecified: Secondary | ICD-10-CM | POA: Diagnosis not present

## 2023-05-04 LAB — BASIC METABOLIC PANEL
Anion gap: 7 (ref 5–15)
BUN: 30 mg/dL — ABNORMAL HIGH (ref 8–23)
CO2: 22 mmol/L (ref 22–32)
Calcium: 8.4 mg/dL — ABNORMAL LOW (ref 8.9–10.3)
Chloride: 104 mmol/L (ref 98–111)
Creatinine, Ser: 1.5 mg/dL — ABNORMAL HIGH (ref 0.61–1.24)
GFR, Estimated: 45 mL/min — ABNORMAL LOW (ref >60)
Glucose, Bld: 102 mg/dL — ABNORMAL HIGH (ref 70–99)
Potassium: 3.4 mmol/L — ABNORMAL LOW (ref 3.5–5.1)
Sodium: 133 mmol/L — ABNORMAL LOW (ref 135–145)

## 2023-05-04 LAB — CBC
HCT: 29.2 % — ABNORMAL LOW (ref 39.0–52.0)
Hemoglobin: 10.4 g/dL — ABNORMAL LOW (ref 13.0–17.0)
MCH: 29.8 pg (ref 26.0–34.0)
MCHC: 35.6 g/dL (ref 30.0–36.0)
MCV: 83.7 fL (ref 80.0–100.0)
Platelets: 357 10*3/uL (ref 150–400)
RBC: 3.49 MIL/uL — ABNORMAL LOW (ref 4.22–5.81)
RDW: 12.5 % (ref 11.5–15.5)
WBC: 13.9 10*3/uL — ABNORMAL HIGH (ref 4.0–10.5)
nRBC: 0 % (ref 0.0–0.2)

## 2023-05-04 LAB — PHOSPHORUS: Phosphorus: 2.4 mg/dL — ABNORMAL LOW (ref 2.5–4.6)

## 2023-05-04 LAB — MAGNESIUM: Magnesium: 1.9 mg/dL (ref 1.7–2.4)

## 2023-05-04 MED ORDER — POTASSIUM CHLORIDE CRYS ER 20 MEQ PO TBCR
40.0000 meq | EXTENDED_RELEASE_TABLET | Freq: Once | ORAL | Status: AC
Start: 2023-05-04 — End: 2023-05-04
  Administered 2023-05-04: 40 meq via ORAL
  Filled 2023-05-04: qty 2

## 2023-05-04 MED ORDER — SODIUM CHLORIDE 0.9% FLUSH: 5.0000 mL | Freq: Every day | INTRAVENOUS | 0 refills | Status: AC

## 2023-05-04 MED ORDER — POTASSIUM PHOSPHATE MONOBASIC 500 MG PO TABS: 500.0000 mg | ORAL_TABLET | Freq: Three times a day (TID) | ORAL | 0 refills | Status: AC

## 2023-05-04 MED ORDER — AMOXICILLIN-POT CLAVULANATE 875-125 MG PO TABS
1.0000 | ORAL_TABLET | Freq: Two times a day (BID) | ORAL | 0 refills | Status: AC
Start: 2023-05-04 — End: 2023-05-16

## 2023-05-04 MED ORDER — OXYCODONE-ACETAMINOPHEN 5-325 MG PO TABS: 1.0000 | ORAL_TABLET | ORAL | 0 refills | Status: AC | PRN

## 2023-05-04 NOTE — Discharge Instructions (Signed)
[facility] [address]  [place], [place]  [phone] Open 24 hours  They will call you to set up when they can come for assessment

## 2023-05-04 NOTE — Progress Notes (Signed)
[facility] SURGICAL PROGRESS NOTE (cpt [phone])  Hospital Day(s): 2.   Interval History: Patient seen and examined, no acute events or new complaints overnight. Patient's mentation much improved this morning; Wife at bedside helps contribute to history. Overnight had small, short, episodes of RUQ pain. Family reports these are significantly improved compared to yesterday. No fever, chills, nausea, emesis. His leukocytosis is improving; WBC 13.9K (from 28.2K). Hgb to 10.4. Renal function stable/baseline; sCr - 1.50; UO - 850 ccs. Mild hypokalemia to 3.4. Percutaneous cholecystotomy tube in RUQ; output 100 ccs and this more bilious appearing today. Cx without growth. He is on Zosyn. He is on CLD; tolerating  Review of Systems:  Unable to reliably perform secondary to AMS + Abdominal pain (improving)  Vital signs in last 24 hours: [min-max] current  Temp:  [97.8 F (36.6 C)-98 F (36.7 C)] 98 F (36.7 C) (01/28 0314) Pulse Rate:  [83-89] 83 (01/28 0314) Resp:  [16-18] 16 (01/28 0314) BP: (136-157)/(43-60) 157/58 (01/28 0314) SpO2:  [94 %-96 %] 94 % (01/28 0314)     Height: 5\' 11"  (180.3 cm) Weight: 78.7 kg BMI (Calculated): 24.21   Intake/Output last 2 shifts:  01/27 0701 - 01/28 0700 In: 1111.1 [P.O.:520; I.V.:512.5; IV Piggyback:68.7] Out: 1150 [Urine:1050; Drains:100]   Physical Exam:  Constitutional: alert, cooperative, mentation improved; wife at bedside  HENT: normocephalic without obvious abnormality  Eyes: PERRL, EOM's grossly intact and symmetric  Respiratory: breathing non-labored at rest  Cardiovascular: regular rate and sinus rhythm  Gastrointestinal: soft, RUQ tenderness, and non-distended. Cholecystostomy tube in RUQ; output more bilious today Musculoskeletal: no edema or wounds, motor and sensation grossly intact, NT    Labs:     Latest Ref Rng & Units 05/04/2023    5:35 AM 05/03/2023    5:36 AM 05/02/2023    4:11 AM  CBC  WBC 4.0 - 10.5 K/uL 13.9   28.2  31.7   Hemoglobin 13.0 - 17.0 g/dL 91.4  78.2  95.6   Hematocrit 39.0 - 52.0 % 29.2  31.9  39.1   Platelets 150 - 400 K/uL 357  367  370       Latest Ref Rng & Units 05/04/2023    5:35 AM 05/03/2023    5:36 AM 05/02/2023    4:11 AM  CMP  Glucose 70 - 99 mg/dL 213  086  578   BUN 8 - 23 mg/dL 30  33  24   Creatinine 0.61 - 1.24 mg/dL 4.69  6.29  5.28   Sodium 135 - 145 mmol/L 133  131  129   Potassium 3.5 - 5.1 mmol/L 3.4  4.1  4.5   Chloride 98 - 111 mmol/L 104  101  97   CO2 22 - 32 mmol/L 22  21  22    Calcium 8.9 - 10.3 mg/dL 8.4  8.6  9.0   Total Protein 6.5 - 8.1 g/dL   6.8   Total Bilirubin 0.0 - 1.2 mg/dL   1.1   Alkaline Phos 38 - 126 U/L   62   AST 15 - 41 U/L   18   ALT 0 - 44 U/L   15      Imaging studies: No new pertinent imaging studies   Assessment/Plan: (ICD-10's: K81.0) 89 y.o. male with acute cholecystitis s/p percutaneous cholecystostomy tube placement (01/26), complicated by dementia/AMS   - He is significantly improved this morning with improvement in his mentation, leukocytosis, and abdominal pain. We can begin to advance diet  as tolerated. Reviewed recommendations with patient's wife and will update instruction.  - Appreciate IR assistance with percutaneous cholecystostomy tube. Again reviewed with his wife that he will need to maintain this for at least 6-8 weeks. Record drainage daily. Will write Rx for flushes; flush daily with 5-10 ccs NS.   - Continue IV Abx (Zosyn); We can do Augmentin for home to complete 14 days total (IV+PO); Cx still pending but without growth   - Monitor abdominal examination  - Monitor leukocytosis; markedly improved - Monitor renal function; stable  - Pain control prn; antiemetics prn   - Mobilize as feasible; up to chair at minimum; therapies on board   - Further management per primary service; we will follow    - Discharge Planning: He is making significant improvement this morning. I would recommend another 24 hours  in house for IV Abx; however, wife really wishes to get him home. At the earliest would recommend this evening as long as he tolerates diet. Antibiotics as above. Will set up follow up on ~3 weeks and update instructions.   All of the above findings and recommendations were discussed with the patient, patient's family (wife at bedside), and the medical team, and all of patient's wife's questions were answered to her expressed satisfaction.  -- Lynden Oxford, PA-C [facility] 05/04/2023, 7:24 AM M-F: 7am - 4pm

## 2023-05-04 NOTE — Progress Notes (Incomplete)
Wife educated on perc drain care and dressing change. She was also educated on how to change foley bag to a leg bag.

## 2023-05-04 NOTE — Discharge Summary (Addendum)
Physician Discharge Summary   Patient: Adrian Aguilar MRN: [id number] DOB: [birth year]  Admit date:     05/01/2023  Discharge date: 05/04/23  Discharge Physician: Marrion Coy   PCP: Langley Gauss, MD   Recommendations at discharge:   Follow-up with PCP in 1 week. Follow-up with general surgery as scheduled. Follow-up with urology as a previously scheduled.  Discharge Diagnoses: Principal Problem:   Cholecystitis Active Problems:   Sepsis due to undetermined organism (HCC)   Hypertensive urgency   Essential hypertension   CKD stage 3b, GFR 30-44 ml/min (HCC)   Dementia (HCC)   Gout   BPH (benign prostatic hyperplasia)   Hyponatremia  Resolved Problems:   * No resolved hospital problems. *  Hospital Course: Adrian Aguilar is a 89 y.o. Caucasian male with medical history significant for essential hypertension and stage III chronic kidney disease, peripheral arterial disease, abdominal aortic aneurysm, and manage who presented to the emergency room with acute onset of abdominal pain and nausea vomiting.  Patient also was very confused before admission. Heart rate up to 100, respirate up to 31, severe leukocytosis of 32.  Ultrasound showed acute cholecystitis with sludge. Cholecystostomy performed on 1/26.  Patient also placed on Zosyn and IV fluids. Culture from cholecystotomy tube negative for 24 hours.  General surgery cleared patient for discharge, symptom resolved.  Patient also tolerating soft diet, medically stable for discharge.  Assessment and Plan: Acute cholecystitis with gallbladder sludge. Acute metabolic encephalopathy secondary to infection. Patient met sepsis criteria as above.  Ultrasound showed evidence of acute cholecystitis with gallbladder sludge. Status post cholecystostomy performed on 1/26.  Culture has no growth. Condition had improved, tolerating soft diet.  Medically stable for discharge.  Will continue Augmentin for 12 days to complete 14 days  antibiotic course.  Also prescribe some pain medicine as needed. Patient had significant altered mental status at admission, consistent with acute metabolic encephalopathy caused by infection.  Condition improved.   Chronic kidney disease stage IIIb. Hyponatremia. Minimal metabolic acidosis. Hypokalemia. Minimal hypophosphatemia. Sodium level is better after fluids.  Give 40 mEq of KCl for potassium 3.4.  Also prescribed potassium phosphate for 1 day (4 doses)     Hypertensive urgency Pressure improved, resume home treatment.     BPH (benign prostatic hyperplasia) with urinary retention. On Flomax, also has chronic Foley catheter.  Follow-up with urology as previously scheduled.   Gout - We will continue colchicine and Uloric.       Consultants: General Surgery. Procedures performed: IR cholecystostomy. Disposition: Home health Diet recommendation:  Discharge Diet Orders (From admission, onward)     Start     Ordered   05/04/23 0000  Diet - low sodium heart healthy        05/04/23 1332           Cardiac diet DISCHARGE MEDICATION: Allergies as of 05/04/2023       Reactions   Meat Extract Itching, Other (See Comments), Rash   Red meat   Rosuvastatin Other (See Comments)   SEVERE LEG PAIN   Allopurinol Other (See Comments)   Lethargy   Propofol Other (See Comments)   Other   Shellfish Allergy Other (See Comments)   According to wife. N/V only        Medication List     TAKE these medications    amoxicillin-clavulanate 875-125 MG tablet Commonly known as: AUGMENTIN Take 1 tablet by mouth 2 (two) times daily for 12 days.   Apoaequorin 10 MG Caps Take  1 tablet by mouth daily.   ascorbic acid 1000 MG tablet Commonly known as: VITAMIN C Take 1,000 mg by mouth daily.   aspirin EC 81 MG tablet Take 81 mg by mouth daily. Swallow whole.   colchicine 0.6 MG tablet Take 0.5 tablets (0.3 mg total) by mouth daily.   febuxostat 40 MG tablet Commonly  known as: ULORIC Take 40 mg by mouth daily.   fluticasone 50 MCG/ACT nasal spray Commonly known as: FLONASE Place 2 sprays into both nostrils daily as needed for rhinitis or allergies.   lisinopril 20 MG tablet Commonly known as: ZESTRIL Take 20 mg by mouth daily.   oxyCODONE-acetaminophen 5-325 MG tablet Commonly known as: Percocet Take 1 tablet by mouth every 4 (four) hours as needed for up to 5 days for severe pain (pain score 7-10).   polyethylene glycol 17 g packet Commonly known as: MiraLax Take 17 g by mouth daily.   Praluent 75 MG/ML Soaj Generic drug: Alirocumab Inject 75 mg into the skin as directed. 75 mg under skin every 14 days   senna-docusate 8.6-50 MG tablet Commonly known as: Senokot-S Take 2 tablets by mouth at bedtime.   sodium chloride flush 0.9 % Soln Commonly known as: NS Inject 5 mLs into the vein daily. Flush gallbladder drain daily with 5 ccs of normal saline   tamsulosin 0.4 MG Caps capsule Commonly known as: FLOMAX Take 1 capsule (0.4 mg total) by mouth daily.               Discharge Care Instructions  (From admission, onward)           Start     Ordered   05/04/23 0000  Discharge wound care:       Comments: Follow with surgery   05/04/23 1332            Follow-up Information     Kandis Cocking, MD. Go on 05/25/2023.   Specialty: General Surgery Why: Go to appointment on 02/18 at 900 AM Contact information: [address] [place] [address] [phone]         Langley Gauss, MD Follow up in 1 week(s).   Specialty: Family Medicine Contact information: [address] [place] [address] [place] [address] [phone]                Discharge Exam: Adrian Aguilar Weights   05/01/23 2154  Weight: 78.7 kg   General exam: Appears calm and comfortable  Respiratory system: Clear to auscultation. Respiratory effort normal. Cardiovascular system: S1 & S2 heard, RRR. No JVD, murmurs,  rubs, gallops or clicks. No pedal edema. Gastrointestinal system: Abdomen is nondistended, soft and nontender. No organomegaly or masses felt. Normal bowel sounds heard. Central nervous system: Alert and oriented x2. No focal neurological deficits. Extremities: Symmetric 5 x 5 power. Skin: No rashes, lesions or ulcers Psychiatry: Judgement and insight appear normal. Mood & affect appropriate.    Condition at discharge: good  The results of significant diagnostics from this hospitalization (including imaging, microbiology, ancillary and laboratory) are listed below for reference.   Imaging Studies: IR Perc Cholecystostomy Result Date: 05/02/2023 CLINICAL DATA:  Pericholecystic inflammatory changes, wall thickening, positive sonographic Murphy sign consistent with acute cholecystitis. Percutaneous drainage requested EXAM: PERCUTANEOUS CHOLECYSTOSTOMY TUBE PLACEMENT WITH ULTRASOUND AND FLUOROSCOPIC GUIDANCE FLUOROSCOPY: Radiation Exposure Index (as provided by the fluoroscopic device): 4 mGy air Kerma TECHNIQUE: The procedure, risks (including but not limited to bleeding, infection, organ damage ), benefits, and alternatives were  explained to the patient and spouse. Questions regarding the procedure were encouraged and answered. The spouse understands and consents to the procedure. Survey ultrasound of the abdomen was performed and an appropriate skin entry site was identified. Skin site was marked, prepped with Betadine, and draped in usual sterile fashion, and infiltrated locally with 1% lidocaine. Intravenous Fentanyl and Versed 1mg  were administered by RN during a total moderate (conscious) sedation time of 10 minutes; the patient's level of consciousness and physiological / cardiorespiratory status were monitored continuously by radiology RN under my direct supervision. Under real-time ultrasound guidance, gallbladder was accessed using a transhepatic approach with a 21-gauge needle. Ultrasound  image documentation was saved. Bile returned through the hub. Needle was exchanged over a 018 guidewire for transitional dilator which allowed placement of 035 J wire. Over this, a 10.2 French pigtail catheter was advanced and formed centrally in the gallbladder lumen. Small contrast injection confirmed appropriate position. Catheter secured externally with 0 Prolene suture and placed external drain bag. Patient tolerated the procedure well. COMPLICATIONS: COMPLICATIONS none IMPRESSION: 1. Technically successful percutaneous cholecystostomy tube placement with ultrasound and fluoroscopic guidance. Electronically Signed   By: Corlis Leak M.D.   On: 05/02/2023 21:07   US ABDOMEN LIMITED RUQ (LIVER/GB) Result Date: 05/02/2023 CLINICAL DATA:  Abdominal pain.  Possible cholecystitis on CT. EXAM: ULTRASOUND ABDOMEN LIMITED RIGHT UPPER QUADRANT COMPARISON:  CTA abdomen and pelvis 05/01/2023 FINDINGS: Gallbladder: Gallbladder sludge is present. The anterior gallbladder wall is thickened measuring up to 3.3 mm. Sonographic Eulah Pont sign is positive per sonographer. No calcified gallstones are seen. Common bile duct: Diameter: 5.1 mm Liver: No focal lesion identified. Increase in parenchymal echogenicity. Portal vein is patent on color Doppler imaging with normal direction of blood flow towards the liver. Other: None. IMPRESSION: 1. Gallbladder sludge with gallbladder wall thickening and positive sonographic Murphy sign. Findings are concerning for acute cholecystitis. 2. Increased hepatic parenchymal echogenicity suggestive of steatosis. Electronically Signed   By: Darliss Cheney M.D.   On: 05/02/2023 00:47   CT Angio Abd/Pel W and/or Wo Contrast Result Date: 05/02/2023 CLINICAL DATA:  History of abdominal aortic aneurysm with stent graft therapy EXAM: CTA ABDOMEN AND PELVIS WITHOUT AND WITH CONTRAST TECHNIQUE: Multidetector CT imaging of the abdomen and pelvis was performed using the standard protocol during bolus  administration of intravenous contrast. Multiplanar reconstructed images and MIPs were obtained and reviewed to evaluate the vascular anatomy. RADIATION DOSE REDUCTION: This exam was performed according to the departmental dose-optimization program which includes automated exposure control, adjustment of the mA and/or kV according to patient size and/or use of iterative reconstruction technique. CONTRAST:  OMNIPAQUE IOHEXOL 350 MG/ML SOLN COMPARISON:  04/17/2023 FINDINGS: VASCULAR Aorta: Atherosclerotic calcifications of the abdominal aorta are noted. Infrarenal abdominal aortic aneurysm is seen with aortic stent graft placement. Stent graft is widely patent. No abnormal enhancement is identified within the aneurysm sac to suggest endoleak. Aorta measures approximately 5.7 cm in greatest diameter stable in appearance from the prior exam. 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 SMA: Calcified origin of the SMA with moderate stenosis. Renals: Single renal arteries are identified bilaterally. Atherosclerotic calcifications are seen without focal hemodynamically significant stenosis. 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 The distal IMA appears within normal limits. Inflow: Stent graft extends into the common iliac arteries bilaterally. Diffuse atherosclerotic calcifications are seen without  focal hemodynamically significant stenosis. Right femoral bypass graft is noted and patent as visualized. Veins: No specific venous abnormality is noted. Review of the MIP images confirms the above findings. NON-VASCULAR Lower chest: Mild atelectatic changes in the bases bilaterally. Hepatobiliary: Liver is within normal limits. Gallbladder is well distended. Some pericholecystic inflammatory changes are noted and indistinct walls which may represent acute  cholecystitis. Ultrasound would be helpful for further evaluation. Pancreas: Unremarkable. No pancreatic ductal dilatation or surrounding inflammatory changes. Spleen: Normal in size without focal abnormality. Adrenals/Urinary Tract: Adrenal glands are within normal limits. Kidneys demonstrate a normal enhancement pattern. Simple right renal cyst is again noted and stable. No follow-up is recommended. No calculi are seen. No obstructive changes are noted. The bladder is decompressed by Foley catheter. Stomach/Bowel: Scattered diverticular change of the colon is noted without evidence of diverticulitis. Fecal material is seen throughout the colon as well without obstructive change. Appendix is within normal limits. Small bowel shows no obstructive changes. Stomach is within normal limits. Lymphatic: No lymphadenopathy is noted. Reproductive: Prostate is unremarkable. Other: No abdominal wall hernia or abnormality. No abdominopelvic ascites. Musculoskeletal: No acute or significant osseous findings. IMPRESSION: VASCULAR Patent aortic stent graft without evidence of endoleak. 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 Patent right fem-distal bypass proximally the distal aspect of the graft is not visualized. NON-VASCULAR Pericholecystic inflammatory changes are noted suspicious for acute cholecystitis. Ultrasound may be helpful for further evaluation. Diverticulosis without diverticulitis. Bibasilar atelectasis. Electronically Signed   By: Alcide Clever M.D.   On: 05/02/2023 00:02   CT HEAD WO CONTRAST ( ) Result Date: 05/01/2023 CLINICAL DATA:  Altered mental status EXAM: CT HEAD WITHOUT CONTRAST TECHNIQUE: Contiguous axial images were obtained from the base of the skull through the vertex without intravenous contrast. RADIATION DOSE REDUCTION: This exam was performed according to the  departmental dose-optimization program which includes automated exposure control, adjustment of the mA and/or kV according to patient size and/or use of iterative reconstruction technique. COMPARISON:  04/17/2023 FINDINGS: Brain: No evidence of acute infarction, hemorrhage, hydrocephalus, extra-axial collection or mass lesion/mass effect. Chronic mild atrophic changes and white matter ischemic changes are noted. Vascular: No hyperdense vessel or unexpected calcification. Skull: Normal. Negative for fracture or focal lesion. Sinuses/Orbits: No acute finding. Other: None. IMPRESSION: No acute intracranial abnormality noted. Electronically Signed   By: Alcide Clever M.D.   On: 05/01/2023 23:53   DG Chest Port 1 View Result Date: 05/01/2023 CLINICAL DATA:  Questionable sepsis. EXAM: PORTABLE CHEST 1 VIEW COMPARISON:  Portable chest 04/17/2023 FINDINGS: The heart size and mediastinal contours are within normal limits. Calcific plaques are again noted in the aortic arch. Both lungs are clear of infiltrates with linear scarring or atelectasis in the left base. The depth of inspiration is less than previously with subsegmental atelectatic change in the right base as well. The visualized skeletal structures are unremarkable. IMPRESSION: No evidence of acute chest disease. Bibasilar subsegmental atelectasis with decreased inspiration. Aortic atherosclerosis. Electronically Signed   By: Almira Bar M.D.   On: 05/01/2023 21:42   CT ABDOMEN PELVIS W CONTRAST Result Date: 04/17/2023 CLINICAL DATA:  Diarrhea.  Abdominal pain. EXAM: CT ABDOMEN AND PELVIS WITH CONTRAST TECHNIQUE: Multidetector CT imaging of the abdomen and pelvis was performed using the standard protocol following bolus administration of intravenous contrast. RADIATION DOSE REDUCTION: This exam was performed according to the departmental dose-optimization program  which includes automated exposure control, adjustment of the mA and/or kV according to patient  size and/or use of iterative reconstruction technique. CONTRAST:  80mL OMNIPAQUE IOHEXOL 300 MG/ML  SOLN COMPARISON:  None Available. FINDINGS: Lower chest: Mild dependent atelectasis is present both lungs. Lungs otherwise clear. The heart size is normal. Artery calcifications are present. No significant pleural or pericardial effusion is present. Hepatobiliary: No focal liver abnormality is seen. No gallstones, gallbladder wall thickening, or biliary dilatation. Pancreas: Unremarkable. No pancreatic ductal dilatation or surrounding inflammatory changes. Spleen: Normal in size without focal abnormality. Adrenals/Urinary Tract: The adrenal glands are normal bilaterally. A simple cyst at the upper pole of the right kidney measures 3.2 cm. A posterior simple cyst measures 10 mm. Smaller cysts are present at the lower pole of the left kidney. No follow-up imaging is recommended no stone or solid mass lesion is present. No obstruction is present. The ureters are within normal limits. A Foley catheter is present within the urinary bladder. Stomach/Bowel: The stomach and duodenum are within normal limits. Small bowel is unremarkable. Terminal ileum is within normal limits. The appendix is visualized and normal. The ascending and transverse colon are normal. Diverticular changes are present in the distal descending and at the sigmoid colon. No focal inflammatory changes are present to suggest diverticulitis. Moderate stool distends the rectum to 7.2 cm and transverse diameter. There is some wall thickening and gas around the stool ball without significant inflammatory change. No proximal obstruction is present. Vascular/Lymphatic: An aortic bi-femoral stent graft is in place. Atherosclerotic calcifications are present in the more proximal aorta and branch vessels. Calcifications are present in the external iliac arteries and femoral arteries. 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 Reproductive: Prostate is unremarkable. Other: No  abdominal wall hernia or abnormality. No abdominopelvic ascites. Musculoskeletal: Grade 1 degenerative anterolisthesis is present at L4-5. Moderate facet hypertrophy is present bilaterally at L4-5 and L5-S1. No focal osseous lesions are present. The bony pelvis is normal. The hips are located and normal bilaterally. IMPRESSION: 1. Moderate stool distends the rectum to 7.2 cm and transverse diameter. There is some wall thickening and gas around the stool ball without significant inflammatory change. Stool impaction may be the source of the patient's pain. 2. Descending and sigmoid diverticulosis without diverticulitis. 3. Aortic bi-femoral stent graft in place. 4. Grade 1 degenerative anterolisthesis at L4-5. 5. Moderate facet hypertrophy bilaterally at L4-5 and L5-S1. 6.  Aortic Atherosclerosis (ICD10-I70.0). Electronically Signed   By: Marin Roberts M.D.   On: 04/17/2023 17:37   CT Head Wo Contrast Result Date: 04/17/2023 CLINICAL DATA:  Altered mental status.  Fall EXAM: CT HEAD WITHOUT CONTRAST CT CERVICAL SPINE WITHOUT CONTRAST TECHNIQUE: Multidetector CT imaging of the head and cervical spine was performed following the standard protocol without intravenous contrast. Multiplanar CT image reconstructions of the cervical spine were also generated. RADIATION DOSE REDUCTION: This exam was performed according to the departmental dose-optimization program which includes automated exposure control, adjustment of the mA and/or kV according to patient size and/or use of iterative reconstruction technique. COMPARISON:  None Available. FINDINGS: CT HEAD FINDINGS Brain: No acute intracranial hemorrhage. No focal mass lesion. No CT evidence of acute infarction. No midline shift or mass effect. No hydrocephalus. Basilar cisterns are patent. There are periventricular and subcortical white matter hypodensities. Generalized cortical atrophy. Vascular: No hyperdense vessel or unexpected calcification. Skull: Normal.  Negative for fracture or focal lesion. Sinuses/Orbits: Paranasal sinuses and mastoid air cells are clear. Orbits are clear. Other: None. CT CERVICAL SPINE  FINDINGS Alignment: Normal alignment of the cervical vertebral bodies. Skull base and vertebrae: Normal craniocervical junction. No loss of vertebral body height or disc height. Normal facet articulation. No evidence of fracture. Soft tissues and spinal canal: No prevertebral soft tissue swelling. No perispinal or epidural hematoma. Disc levels: Anterior osteophytes from C5 to C7 with mild disc space narrowing. No acute findings. Upper chest: Clear Other: None IMPRESSION: 1. No acute intracranial findings. 2. Atrophy and white matter microvascular disease. 3. No cervical spine fracture. 4. Mild multilevel disc osteophytic disease. Electronically Signed   By: Genevive Bi M.D.   On: 04/17/2023 16:23   CT Cervical Spine Wo Contrast Result Date: 04/17/2023 CLINICAL DATA:  Altered mental status.  Fall EXAM: CT HEAD WITHOUT CONTRAST CT CERVICAL SPINE WITHOUT CONTRAST TECHNIQUE: Multidetector CT imaging of the head and cervical spine was performed following the standard protocol without intravenous contrast. Multiplanar CT image reconstructions of the cervical spine were also generated. RADIATION DOSE REDUCTION: This exam was performed according to the departmental dose-optimization program which includes automated exposure control, adjustment of the mA and/or kV according to patient size and/or use of iterative reconstruction technique. COMPARISON:  None Available. FINDINGS: CT HEAD FINDINGS Brain: No acute intracranial hemorrhage. No focal mass lesion. No CT evidence of acute infarction. No midline shift or mass effect. No hydrocephalus. Basilar cisterns are patent. There are periventricular and subcortical white matter hypodensities. Generalized cortical atrophy. Vascular: No hyperdense vessel or unexpected calcification. Skull: Normal. Negative for fracture  or focal lesion. Sinuses/Orbits: Paranasal sinuses and mastoid air cells are clear. Orbits are clear. Other: None. CT CERVICAL SPINE FINDINGS Alignment: Normal alignment of the cervical vertebral bodies. Skull base and vertebrae: Normal craniocervical junction. No loss of vertebral body height or disc height. Normal facet articulation. No evidence of fracture. Soft tissues and spinal canal: No prevertebral soft tissue swelling. No perispinal or epidural hematoma. Disc levels: Anterior osteophytes from C5 to C7 with mild disc space narrowing. No acute findings. Upper chest: Clear Other: None IMPRESSION: 1. No acute intracranial findings. 2. Atrophy and white matter microvascular disease. 3. No cervical spine fracture. 4. Mild multilevel disc osteophytic disease. Electronically Signed   By: Genevive Bi M.D.   On: 04/17/2023 16:23   DG Chest Port 1 View Result Date: 04/17/2023 CLINICAL DATA:  Sepsis, altered mental status EXAM: PORTABLE CHEST 1 VIEW COMPARISON:  None Available. FINDINGS: Atherosclerotic calcification of the aortic arch. The overlap of the left posterior seventh rib and inferior scapular tip, an 8 mm density is present and a true intrapulmonary nodule is not excluded. Chest CT is recommended for further characterization. The lungs appear otherwise clear. Cardiac and mediastinal margins appear normal. IMPRESSION: 1. 8 mm density in the left lung base, possibly a true intrapulmonary nodule. Chest CT is recommended for further characterization. 2.  Aortic Atherosclerosis (ICD10-I70.0). Electronically Signed   By: Gaylyn Rong M.D.   On: 04/17/2023 15:47    Microbiology: Results for orders placed or performed during the hospital encounter of 05/01/23  Blood Culture (routine x 2)     Status: None (Preliminary result)   Collection Time: 05/01/23  9:35 PM   Specimen: BLOOD  Result Value Ref Range Status   Specimen Description BLOOD BLOOD LEFT ARM  Final   Special Requests   Final     BOTTLES DRAWN AEROBIC AND ANAEROBIC Blood Culture results may not be optimal due to an inadequate volume of blood received in culture bottles   Culture   Final  NO GROWTH 3 DAYS Performed at [facility], [address] [place], [place] [address]    Report Status PENDING  Incomplete  Resp panel by RT-PCR (RSV, Flu A&B, Covid) Anterior Nasal Swab     Status: None   Collection Time: 05/01/23  9:37 PM   Specimen: Anterior Nasal Swab  Result Value Ref Range Status   SARS Coronavirus 2 by RT PCR NEGATIVE NEGATIVE Final    Comment: (NOTE) SARS-CoV-2 target nucleic acids are NOT DETECTED.  The SARS-CoV-2 RNA is generally detectable in upper respiratory specimens during the acute phase of infection. The lowest concentration of SARS-CoV-2 viral copies this assay can detect is 138 copies/mL. A negative result does not preclude SARS-Cov-2 infection and should not be used as the sole basis for treatment or other patient management decisions. A negative result may occur with  improper specimen collection/handling, submission of specimen other than nasopharyngeal swab, presence of viral mutation(s) within the areas targeted by this assay, and inadequate number of viral copies(<138 copies/mL). A negative result must be combined with clinical observations, patient history, and epidemiological information. The expected result is Negative.  Fact Sheet for Patients:  BloggerCourse.com  Fact Sheet for Healthcare Providers:  SeriousBroker.it  This test is no t yet approved or cleared by the [place] FDA and  has been authorized for detection and/or diagnosis of SARS-CoV-2 by FDA under an Emergency Use Authorization (EUA). This EUA will remain  in effect (meaning this test can be used) for the duration of the COVID-19 declaration under Section 564(b)(1) of the Act, 21 U.S.C.section 360bbb-3(b)(1), unless the authorization is  terminated  or revoked sooner.       Influenza A by PCR NEGATIVE NEGATIVE Final   Influenza B by PCR NEGATIVE NEGATIVE Final    Comment: (NOTE) The Xpert Xpress SARS-CoV-2/FLU/RSV plus assay is intended as an aid in the diagnosis of influenza from Nasopharyngeal swab specimens and should not be used as a sole basis for treatment. Nasal washings and aspirates are unacceptable for Xpert Xpress SARS-CoV-2/FLU/RSV testing.  Fact Sheet for Patients: BloggerCourse.com  Fact Sheet for Healthcare Providers: SeriousBroker.it  This test is not yet approved or cleared by the [place] FDA and has been authorized for detection and/or diagnosis of SARS-CoV-2 by FDA under an Emergency Use Authorization (EUA). This EUA will remain in effect (meaning this test can be used) for the duration of the COVID-19 declaration under Section 564(b)(1) of the Act, 21 U.S.C. section 360bbb-3(b)(1), unless the authorization is terminated or revoked.     Resp Syncytial Virus by PCR NEGATIVE NEGATIVE Final    Comment: (NOTE) Fact Sheet for Patients: BloggerCourse.com  Fact Sheet for Healthcare Providers: SeriousBroker.it  This test is not yet approved or cleared by the [place] FDA and has been authorized for detection and/or diagnosis of SARS-CoV-2 by FDA under an Emergency Use Authorization (EUA). This EUA will remain in effect (meaning this test can be used) for the duration of the COVID-19 declaration under Section 564(b)(1) of the Act, 21 U.S.C. section 360bbb-3(b)(1), unless the authorization is terminated or revoked.  Performed at [facility], [address] [place], [place] [address]   Blood Culture (routine x 2)     Status: None (Preliminary result)   Collection Time: 05/01/23  9:44 PM   Specimen: BLOOD  Result Value Ref Range Status   Specimen Description BLOOD BLOOD  RIGHT ARM  Final   Special Requests   Final    BOTTLES DRAWN AEROBIC ONLY  Blood Culture results may not be optimal due to an inadequate volume of blood received in culture bottles   Culture   Final    NO GROWTH 3 DAYS Performed at [facility], [address] [place], [place] [address]    Report Status PENDING  Incomplete  Aerobic/Anaerobic Culture w Gram Stain (surgical/deep wound)     Status: None (Preliminary result)   Collection Time: 05/02/23  1:42 PM   Specimen: Gallbladder; Bile  Result Value Ref Range Status   Specimen Description   Final    GALL BLADDER Performed at [facility], [address], [place], [place] [address]    Special Requests   Final    NONE Performed at [facility], [address] [place], [place] [address]    Gram Stain NO WBC SEEN NO ORGANISMS SEEN   Final   Culture   Final    NO GROWTH 2 DAYS NO ANAEROBES ISOLATED; CULTURE IN PROGRESS FOR 5 DAYS Performed at [facility], [address], [place], [place] [address]    Report Status PENDING  Incomplete    Labs: CBC: Recent Labs  Lab 05/01/23 2137 05/02/23 0411 05/03/23 0536 05/04/23 0535  WBC 32.0* 31.7* 28.2* 13.9*  NEUTROABS 27.7*  --   --   --   HGB 15.5 13.8 11.3* 10.4*  HCT 44.2 39.1 31.9* 29.2*  MCV 85.3 84.4 83.5 83.7  PLT 419* 370 367 357   Basic Metabolic Panel: Recent Labs  Lab 05/01/23 2137 05/02/23 0411 05/03/23 0536 05/04/23 0535  NA 131* 129* 131* 133*  K 4.8 4.5 4.1 3.4*  CL 95* 97* 101 104  CO2 20* 22 21* 22  GLUCOSE 173* 155* 108* 102*  BUN 26* 24* 33* 30*  CREATININE 1.36* 1.29* 1.63* 1.50*  CALCIUM 9.7 9.0 8.6* 8.4*  MG  --   --  1.8 1.9  PHOS  --   --   --  2.4*   Liver Function Tests: Recent Labs  Lab 05/01/23 2137 05/02/23 0411  AST 21 18  ALT 18 15  ALKPHOS 71 62  BILITOT 1.3* 1.1  PROT 8.1 6.8  ALBUMIN 4.1 3.5   CBG: No results for input(s): "GLUCAP" in the last 168 hours.  Discharge time spent:  greater than 30 minutes.  Signed: Marrion Coy, MD [facility] 05/04/2023

## 2023-05-04 NOTE — TOC Transition Note (Signed)
Transition of Care [name]) - Discharge Note   Patient Details  Name: Adrian Aguilar MRN: [id number] Date of Birth: 06/27/1932  [facility]) CM/SW Contact:  Chapman Fitch, RN Phone Number: 05/04/2023, 2:19 PM   Clinical Narrative:     Patient to discharge today Therapy recommending home health  Wife in agreement and states that she does not have a preference. CMS Medicare.gov Compare Post Acute Care list reviewed with wife.   Referral made to [facility] with Frances Furbish Wife to transport    Barriers to Discharge: Continued Medical Work up   Patient Goals and CMS Choice            Discharge Placement                       Discharge Plan and Services Additional resources added to the After Visit Summary for       Post Acute Care Choice: Home Health                               Social Drivers of Health (SDOH) Interventions SDOH Screenings   Food Insecurity: Patient Unable To Answer (05/02/2023)  Housing: Patient Unable To Answer (05/02/2023)  Transportation Needs: No Transportation Needs (04/20/2023)  Utilities: Not At Risk (04/20/2023)  Financial Resource Strain: Low Risk  (03/08/2022)   Received from [facility]  Physical Activity: Unknown (03/08/2022)   Received from [facility]  Social Connections: Patient Unable To Answer (05/02/2023)  Recent Concern: Social Connections - Moderately Isolated (04/20/2023)  Stress: No Stress Concern Present (03/08/2022)   Received from [facility]  Tobacco Use: Low Risk  (05/02/2023)     Readmission Risk Interventions    05/03/2023   12:57 PM  Readmission Risk Prevention Plan  Transportation Screening Complete  PCP or Specialist Appt within 3-5 Days Complete  Social Work Consult for Recovery Care Planning/Counseling Complete  Palliative Care Screening Not Applicable  Medication Review Oceanographer) Complete

## 2023-05-04 NOTE — Evaluation (Signed)
Physical Therapy Evaluation Patient Details Name: Adrian Aguilar MRN: [id number] DOB: 1932/12/23 Today's Date: 05/04/2023  History of Present Illness  Pt is an 89 year old male who presents with acute cholecystitis s/p percutaneous cholecystostomy tube placement (01/26), complicated by dementia/AMS. PMHx: CKD 3B, PAD s/p aortic bifurcation stenting, and hypertension.   Clinical Impression  Pt was pleasantly confused and with extra time and cuing was able to follow 1-step commands and put forth good throughout.  Pt required no physical assistance during the session and only minimal cuing for general sequencing with transfers and gait.  Pt was generally steady with ambulation with the RW with no overt LOB and no adverse symptoms reported, SpO2 and HR WNL on room air.   Spouse stated pt performed functional tasks during this session better than he was at home and stated she will be taking patient home at discharge with assistance from her sister.  Pt will benefit from continued PT services upon discharge to safely address deficits listed in patient problem list for decreased caregiver assistance and eventual return to PLOF.          If plan is discharge home, recommend the following: Assistance with cooking/housework;Direct supervision/assist for medications management;Assist for transportation;Help with stairs or ramp for entrance;A little help with walking and/or transfers;A little help with bathing/dressing/bathroom;Supervision due to cognitive status   Can travel by private vehicle   Yes    Equipment Recommendations None recommended by PT  Recommendations for Other Services       Functional Status Assessment Patient has had a recent decline in their functional status and demonstrates the ability to make significant improvements in function in a reasonable and predictable amount of time.     Precautions / Restrictions Precautions Precautions: Fall Restrictions Weight Bearing  Restrictions Per Provider Order: No Other Position/Activity Restrictions: cholecystostomy tube      Mobility  Bed Mobility Overal bed mobility: Needs Assistance       Supine to sit: Supervision     General bed mobility comments: extra time, effort and cueing required for sequencing but no physical assist needed    Transfers Overall transfer level: Needs assistance Equipment used: Rolling walker (2 wheels) Transfers: Sit to/from Stand Sit to Stand: Contact guard assist           General transfer comment: Min verbal cues for sequencing for hand placement    Ambulation/Gait Ambulation/Gait assistance: Contact guard assist Gait Distance (Feet): 30 Feet Assistive device: Rolling walker (2 wheels) Gait Pattern/deviations: Step-through pattern, Decreased step length - right, Decreased step length - left, Trunk flexed Gait velocity: decreased     General Gait Details: Slow cadence with short B step length but steady without LOB  Stairs            Wheelchair Mobility     Tilt Bed    Modified Rankin (Stroke Patients Only)       Balance Overall balance assessment: Needs assistance, History of Falls   Sitting balance-Leahy Scale: Good     Standing balance support: Bilateral upper extremity supported, During functional activity, Reliant on assistive device for balance Standing balance-Leahy Scale: Fair                               Pertinent Vitals/Pain Pain Assessment Pain Assessment: No/denies pain    Home Living Family/patient expects to be discharged to:: Private residence Living Arrangements: Spouse/significant other;Non-relatives/Friends Available Help at Discharge: Family;Available 24 hours/day Type  of Home: House Home Access: Stairs to enter Entrance Stairs-Rails: None Entrance Stairs-Number of Steps: 2 Alternate Level Stairs-Number of Steps: 13 Home Layout: Two level;Able to live on main level with bedroom/bathroom Home  Equipment: Rolling Walker (2 wheels);BSC/3in1 Additional Comments: Spouse present and assisted with history secondary to pt being a poor historian    Prior Function Prior Level of Function : Needs assist             Mobility Comments: Ind amb without an AD community distances, 1-2 falls in the last 6 months with recent fall secondary to weakness and legs "giving out" ADLs Comments: Pt Ind with bathing and dressing with spouse assisting with meals, meds, and transportation     Extremity/Trunk Assessment   Upper Extremity Assessment Upper Extremity Assessment: Generalized weakness    Lower Extremity Assessment Lower Extremity Assessment: Generalized weakness       Communication   Communication Communication: Hearing impairment Following commands: Follows one step commands with increased time Cueing Techniques: Verbal cues;Tactile cues;Visual cues  Cognition Arousal: Alert Behavior During Therapy: WFL for tasks assessed/performed Overall Cognitive Status: History of cognitive impairments - at baseline                                          General Comments      Exercises     Assessment/Plan    PT Assessment Patient needs continued PT services  PT Problem List Decreased strength;Decreased activity tolerance;Decreased balance;Decreased mobility;Decreased knowledge of use of DME       PT Treatment Interventions DME instruction;Gait training;Stair training;Functional mobility training;Therapeutic activities;Therapeutic exercise;Balance training;Patient/family education    PT Goals (Current goals can be found in the Care Plan section)  Acute Rehab PT Goals Patient Stated Goal: To return home with HHPT PT Goal Formulation: With family Time For Goal Achievement: 05/17/23 Potential to Achieve Goals: Good    Frequency Min 1X/week     Co-evaluation               AM-PAC PT "6 Clicks" Mobility  Outcome Measure Help needed turning from your  back to your side while in a flat bed without using bedrails?: A Little Help needed moving from lying on your back to sitting on the side of a flat bed without using bedrails?: A Little Help needed moving to and from a bed to a chair (including a wheelchair)?: A Little Help needed standing up from a chair using your arms (e.g., wheelchair or bedside chair)?: A Little Help needed to walk in hospital room?: A Little Help needed climbing 3-5 steps with a railing? : A Lot 6 Click Score: 17    End of Session Equipment Utilized During Treatment: Gait belt Activity Tolerance: Patient tolerated treatment well Patient left: in chair;with call bell/phone within reach;with chair alarm set;with family/visitor present Nurse Communication: Mobility status (Per nursing ok to leave cholecystostomy tube pouch safety pinned to gown) PT Visit Diagnosis: History of falling (Z91.81);Unsteadiness on feet (R26.81);Difficulty in walking, not elsewhere classified (R26.2);Muscle weakness (generalized) (M62.81)    Time: 4098-1191 PT Time Calculation (min) (ACUTE ONLY): 24 min   Charges:   PT Evaluation $PT Eval Moderate Complexity: 1 Mod PT Treatments $Gait Training: 8-22 mins PT General Charges $$ ACUTE PT VISIT: 1 Visit    D. Scott Syesha Thaw PT, DPT 05/04/23, 11:31 AM

## 2023-05-06 LAB — CULTURE, BLOOD (ROUTINE X 2)
Culture: NO GROWTH
Culture: NO GROWTH

## 2023-05-07 LAB — AEROBIC/ANAEROBIC CULTURE W GRAM STAIN (SURGICAL/DEEP WOUND)
Culture: NO GROWTH
Gram Stain: NONE SEEN

## 2023-05-13 ENCOUNTER — Encounter: Payer: Self-pay | Admitting: Urology

## 2023-05-13 ENCOUNTER — Ambulatory Visit: Payer: Medicare Other | Admitting: Urology

## 2023-05-13 VITALS — BP 158/68 | HR 81 | Ht 71.0 in | Wt 164.0 lb

## 2023-05-13 DIAGNOSIS — B356 Tinea cruris: Secondary | ICD-10-CM | POA: Diagnosis not present

## 2023-05-13 DIAGNOSIS — R339 Retention of urine, unspecified: Secondary | ICD-10-CM | POA: Diagnosis not present

## 2023-05-13 LAB — BLADDER SCAN AMB NON-IMAGING

## 2023-05-13 MED ORDER — NYSTATIN 100000 UNIT/GM EX POWD: 1.0000 | Freq: Three times a day (TID) | CUTANEOUS | 0 refills | Status: DC

## 2023-05-13 MED ORDER — TAMSULOSIN HCL 0.4 MG PO CAPS: 0.4000 mg | ORAL_CAPSULE | Freq: Every day | ORAL | 3 refills | Status: DC

## 2023-05-13 NOTE — Progress Notes (Signed)
 Fill and Pull Catheter Removal  Patient is present today for a catheter removal.  Patient was cleaned and prepped in a sterile fashion 200 ml of sterile water / saline was instilled into the bladder when the patient felt the urge to urinate. 8 ml of water  was then drained from the balloon.  A 14 FR foley cath was removed from the bladder no complications were noted .  Patient as then given some time to void on their own.  Patient cannot void   Patient tolerated well.  Performed by: Eren Puebla H RMA  Follow up/ Additional notes: f/u in the pm today if not able to urinate.

## 2023-05-13 NOTE — Progress Notes (Signed)
 Simple Catheter Placement  Due to urinary retention patient is present today for a foley cath placement.  Patient was cleaned and prepped in a sterile fashion with betadine and 2% lidocaine  jelly was instilled into the urethra. A 18 FR Coude foley catheter was inserted, urine return was noted  900 ml, urine was yellow in color.  The balloon was filled with 10cc of sterile water .  A leg bag was attached for drainage. Patient was also given a night bag to take home and was given instruction on how to change from one bag to another.  Patient was given instruction on proper catheter care.  Patient tolerated well, no complications were noted   Performed by: CLOTILDA CORNWALL, PA-C   Additional notes/ Follow up: Mild urethral erosion was noted on exam.  He also had tinea cruris.  Nystatin  powder was sent to the pharmacy this morning.  Advised his wife to keep the catheter on slack so we can prevent any further erosion from occurring.  I also sent in tamsulosin  0.4 mg nightly to start tonight.    Return in about 1 week (around 05/20/2023) for Trial of void .

## 2023-05-13 NOTE — Progress Notes (Signed)
 Adrian Aguilar,acting as a scribe for Adrian Riis, MD.,have documented all relevant documentation on the behalf of Adrian Riis, MD,as directed by  Adrian Riis, MD while in the presence of Adrian Riis, MD.  05/13/23 10:33 AM   Adrian Aguilar Mar 23, 1933 [id number]  Referring provider: Lemon Lamar Flavors, MD [address] [place],  [place] [address]  Chief Complaint  Patient presents with   Urinary Retention    V/T    HPI: 89 year-old male with multiple medical comorbidities and multiple recent hospital admissions for various issues, including altered mental status, fecal impaction, and cholecystitis. At his discharge on 04/22/2023, he was discharged with a Foley catheter placement in the setting of acute urinary retention. Outpatient follow up was scheduled for a voiding trial.  He ended up getting readmitted later that month. There is no clear documentation that a voiding trial was attempted during that admission. He did have a CT scan on his initial presentation on 04/17/2023, at which time a Foley catheter was already in place. It showed decompressed bladder with normal kidneys bilaterally.   His most recent creatinine on 05/04/2023 was 1.5, which was around his baseline. His creatinine was as high as 2.18 during the previous admission.   He does not have any recent PSA's, but there is no evidence of obvious metastatic disease on his CT scan.   He is still on Augmentin  for his cholecystitis.   He has not experienced urinary issues prior to these events. The attending physician in the hospital suggested that the urinary retention and bowel blockage might be related to gallbladder problems. He has not been on Flomax  since discharge, although it was administered during the hospital stay.   He is experiencing a rash, suspected to be a yeast infection, in the groin area.  He is accompanied today by his wife and sister-in-law who are his caretakers.   They provide additional history today as he has advanced dementia.   PMH: Past Medical History:  Diagnosis Date   AAA (abdominal aortic aneurysm) (HCC)    Hypertension    PAD (peripheral artery disease) (HCC)    PAD with stent placed to artery in R leg   Renal disorder    stage 3 CKD    Surgical History: Past Surgical History:  Procedure Laterality Date   CATARACT EXTRACTION W/ INTRAOCULAR LENS IMPLANT Bilateral    COARCTATION OF AORTA REPAIR     IR PERC CHOLECYSTOSTOMY  05/02/2023    Home Medications:  Allergies as of 05/13/2023       Reactions   Meat Extract Itching, Other (See Comments), Rash   Red meat   Rosuvastatin Other (See Comments)   SEVERE LEG PAIN   Allopurinol Other (See Comments)   Lethargy   Propofol  Other (See Comments)   Other   Shellfish Allergy Other (See Comments)   According to wife. N/V only        Medication List        Accurate as of May 13, 2023 10:33 AM. If you have any questions, ask your nurse or doctor.          STOP taking these medications    tamsulosin  0.4 MG Caps capsule Commonly known as: FLOMAX  Stopped by: Adrian Aguilar       TAKE these medications    amoxicillin -clavulanate 875-125 MG tablet Commonly known as: AUGMENTIN  Take 1 tablet by mouth 2 (two) times daily for 12 days.   Apoaequorin 10 MG Caps Take 1  tablet by mouth daily.   ascorbic acid  1000 MG tablet Commonly known as: VITAMIN C  Take 1,000 mg by mouth daily.   aspirin EC 81 MG tablet Take 81 mg by mouth daily. Swallow whole.   colchicine  0.6 MG tablet Take 0.5 tablets (0.3 mg total) by mouth daily.   febuxostat  40 MG tablet Commonly known as: ULORIC  Take 40 mg by mouth daily.   fluticasone 50 MCG/ACT nasal spray Commonly known as: FLONASE Place 2 sprays into both nostrils daily as needed for rhinitis or allergies.   lisinopril  20 MG tablet Commonly known as: ZESTRIL  Take 20 mg by mouth daily.   nystatin  powder Commonly known as:  MYCOSTATIN /NYSTOP  Apply 1 Application topically 3 (three) times daily. Started by: Adrian Aguilar   polyethylene glycol 17 g packet Commonly known as: MiraLax  Take 17 g by mouth daily.   Praluent 75 MG/ML Soaj Generic drug: Alirocumab Inject 75 mg into the skin as directed. 75 mg under skin every 14 days   senna-docusate 8.6-50 MG tablet Commonly known as: Senokot-S Take 2 tablets by mouth at bedtime.   sodium chloride  flush 0.9 % Soln Commonly known as: NS Inject 5 mLs into the vein daily. Flush gallbladder drain daily with 5 ccs of normal saline        Allergies:  Allergies  Allergen Reactions   Meat Extract Itching, Other (See Comments) and Rash    Red meat   Rosuvastatin Other (See Comments)    SEVERE LEG PAIN   Allopurinol Other (See Comments)    Lethargy   Propofol  Other (See Comments)    Other   Shellfish Allergy Other (See Comments)    According to wife. N/V only    Family History: Family History  Problem Relation Age of Onset   Diabetes Brother     Social History:  reports that he has never smoked. He has never used smokeless tobacco. He reports that he does not currently use alcohol. He reports that he does not use drugs.   Physical Exam: BP (!) 158/68   Pulse 81   Ht 5' 11 (1.803 m)   Wt 164 lb (74.4 kg)   BMI 22.87 kg/m   Constitutional: Alert but not conversive. HEENT: [place] AT, moist mucus membranes.  Trachea midline, no masses. GU: Circumcised phallus, bilaterally descended testicles, erythema in bilateral inguinal folds consistent with candida. Neurologic: Grossly intact, no focal deficits, moving all 4 extremities. Psychiatric: Normal mood and affect.   Assessment & Plan:    1. Urinary retention - Voiding trial today - If he is unable to void this afternoon, consider re-insertion of the catheter and initiation of Flomax  to aid in bladder emptying.   - Etiology is a combination of underlying BPH, severe constipation, medical illness,  dementia, inactivity,  narcotics amongst others - Follow-up this afternoon to assess bladder emptying via bladder scan. -No additional antibiotics administered today as he is still on Augmentin .  2. Yeast infection - Nystatin  powder for topical application to affected areas.  - Recommend over-the-counter antifungal cream if needed, but prefer powder to reduce moisture.  Return for bladder scan.  I have reviewed the above documentation for accuracy and completeness, and I agree with the above.   Adrian Riis, MD   [facility] [address] [place], [place] [address] [phone]

## 2023-05-18 NOTE — Progress Notes (Unsigned)
05/20/2023 4:56 PM   Adrian Aguilar [birth year] [id number]  Referring provider: Langley Gauss, MD [address] [place],  [place] [address]  Urological history: 1. BPH with retention -Occurred during hospitalization in January for altered mental status, fecal impaction and cholecystitis -Failed voiding trial last week -Tamsulosin 0.4 mg daily  Chief Complaint  Patient presents with   voiding trial   HPI: Adrian Aguilar is a 89 y.o. male who presents today for TOV.   Previous records reviewed.   PMH: Past Medical History:  Diagnosis Date   AAA (abdominal aortic aneurysm) (HCC)    Hypertension    PAD (peripheral artery disease) (HCC)    PAD with stent placed to artery in R leg   Renal disorder    stage 3 CKD    Surgical History: Past Surgical History:  Procedure Laterality Date   CATARACT EXTRACTION W/ INTRAOCULAR LENS IMPLANT Bilateral    COARCTATION OF AORTA REPAIR     IR PERC CHOLECYSTOSTOMY  05/02/2023    Home Medications:  Allergies as of 05/20/2023       Reactions   Meat Extract Itching, Other (See Comments), Rash   Red meat   Rosuvastatin Other (See Comments)   SEVERE LEG PAIN   Allopurinol Other (See Comments)   Lethargy   Propofol Other (See Comments)   Other   Shellfish Allergy Other (See Comments)   According to wife. N/V only        Medication List        Accurate as of May 20, 2023  4:56 PM. If you have any questions, ask your nurse or doctor.          Apoaequorin 10 MG Caps Take 1 tablet by mouth daily.   ascorbic acid 1000 MG tablet Commonly known as: VITAMIN C Take 1,000 mg by mouth daily.   aspirin EC 81 MG tablet Take 81 mg by mouth daily. Swallow whole.   colchicine 0.6 MG tablet Take 0.5 tablets (0.3 mg total) by mouth daily.   febuxostat 40 MG tablet Commonly known as: ULORIC Take 40 mg by mouth daily.   fluticasone 50 MCG/ACT nasal spray Commonly known as: FLONASE Place  2 sprays into both nostrils daily as needed for rhinitis or allergies.   lisinopril 20 MG tablet Commonly known as: ZESTRIL Take 20 mg by mouth daily.   nystatin powder Commonly known as: MYCOSTATIN/NYSTOP Apply 1 Application topically 3 (three) times daily.   polyethylene glycol 17 g packet Commonly known as: MiraLax Take 17 g by mouth daily.   Praluent 75 MG/ML Soaj Generic drug: Alirocumab Inject 75 mg into the skin as directed. 75 mg under skin every 14 days   senna-docusate 8.6-50 MG tablet Commonly known as: Senokot-S Take 2 tablets by mouth at bedtime.   sodium chloride flush 0.9 % Soln Commonly known as: NS Inject 5 mLs into the vein daily. Flush gallbladder drain daily with 5 ccs of normal saline   tamsulosin 0.4 MG Caps capsule Commonly known as: FLOMAX Take 1 capsule (0.4 mg total) by mouth daily.        Allergies:  Allergies  Allergen Reactions   Meat Extract Itching, Other (See Comments) and Rash    Red meat   Rosuvastatin Other (See Comments)    SEVERE LEG PAIN   Allopurinol Other (See Comments)    Lethargy   Propofol Other (See Comments)    Other   Shellfish Allergy Other (See Comments)  According to wife. N/V only    Family History: Family History  Problem Relation Age of Onset   Diabetes Brother     Social History:  reports that he has never smoked. He has never been exposed to tobacco smoke. He has never used smokeless tobacco. He reports that he does not currently use alcohol. He reports that he does not use drugs.  ROS: Pertinent ROS in HPI  Physical Exam: Ht 5\' 7"  (1.702 m)   Wt 164 lb (74.4 kg)   BMI 25.69 kg/m   Constitutional:  Well nourished. Alert and oriented, No acute distress. HEENT: [place] AT, moist mucus membranes.  Trachea midline, no masses. Cardiovascular: No clubbing, cyanosis, or edema. Respiratory: Normal respiratory effort, no increased work of breathing. GU: No CVA tenderness.  No bladder fullness or masses.   Patient with uncircumcised phallus. Foreskin easily retracted  Urethral meatus is patent.  No penile discharge. No penile lesions or rashes.  Neurologic: Grossly intact, no focal deficits, moving all 4 extremities. Psychiatric: Normal mood and affect.  Laboratory Data: Lab Results  Component Value Date   WBC 13.9 (H) 05/04/2023   HGB 10.4 (L) 05/04/2023   HCT 29.2 (L) 05/04/2023   MCV 83.7 05/04/2023   PLT 357 05/04/2023    Lab Results  Component Value Date   CREATININE 1.50 (H) 05/04/2023    Lab Results  Component Value Date   TSH 4.133 04/19/2023    Lab Results  Component Value Date   AST 18 05/02/2023   Lab Results  Component Value Date   ALT 15 05/02/2023  I have reviewed the labs.   Pertinent Imaging:  05/20/2023  Scan Result 752 ml    Simple Catheter Placement  Due to urinary retention patient is present today for a foley cath placement.  Patient was cleaned and prepped in a sterile fashion with betadine and 2% lidocaine jelly was instilled into the urethra. A 16 FR Coude foley catheter was inserted, urine return was noted  650 ml, urine was yellow in color.  The balloon was filled with 10cc of sterile water.  A leg bag was attached for drainage. Patient was also given a night bag to take home and was given instruction on how to change from one bag to another.  Patient was given instruction on proper catheter care.  Patient tolerated well, no complications were noted   Performed by: Michiel Cowboy, PA-C and Emi Belfast, CMA    Assessment & Plan:    1. BPH with retention  -Patient failed voiding trial  -Patient tolerated tamsulosin 0.4 mg daily, so we will increase it to tamsulosin twice daily and attempt another voiding trial next week  Return in about 1 week (around 05/27/2023) for TOV/PVR.  These notes generated with voice recognition software. I apologize for typographical errors.  Cloretta Ned  [facility] [address]  [address] [place], [place] [address] [phone]

## 2023-05-20 ENCOUNTER — Encounter: Payer: Self-pay | Admitting: Urology

## 2023-05-20 ENCOUNTER — Ambulatory Visit: Payer: Medicare Other | Admitting: Urology

## 2023-05-20 VITALS — Ht 67.0 in | Wt 164.0 lb

## 2023-05-20 DIAGNOSIS — N138 Other obstructive and reflux uropathy: Secondary | ICD-10-CM

## 2023-05-20 DIAGNOSIS — N401 Enlarged prostate with lower urinary tract symptoms: Secondary | ICD-10-CM

## 2023-05-20 DIAGNOSIS — R338 Other retention of urine: Secondary | ICD-10-CM

## 2023-05-20 LAB — BLADDER SCAN AMB NON-IMAGING: Scan Result: 752

## 2023-05-20 NOTE — Progress Notes (Signed)
Catheter Removal  Patient is present today for a catheter removal.  8ml of water was drained from the balloon. A 16FR foley cath was removed from the bladder, no complications were noted. Patient tolerated well.  Performed by: Ples Specter   Follow up/ Additional notes: This afternoon

## 2023-05-25 ENCOUNTER — Other Ambulatory Visit: Payer: Self-pay | Admitting: General Surgery

## 2023-05-25 ENCOUNTER — Encounter: Payer: Self-pay | Admitting: General Surgery

## 2023-05-25 ENCOUNTER — Ambulatory Visit: Payer: Medicare Other | Admitting: General Surgery

## 2023-05-25 VITALS — BP 129/79 | HR 75 | Temp 98.2°F | Ht 71.0 in | Wt 155.6 lb

## 2023-05-25 DIAGNOSIS — K810 Acute cholecystitis: Secondary | ICD-10-CM | POA: Diagnosis not present

## 2023-05-25 DIAGNOSIS — K819 Cholecystitis, unspecified: Secondary | ICD-10-CM

## 2023-05-25 NOTE — Progress Notes (Signed)
 Outpatient Surgical Follow Up  05/25/2023  Adrian Aguilar is an 89 y.o. male.   Chief Complaint  Patient presents with   Hospitalization Follow-up    HPI: The patient returns today in hospital follow-up after having cholecystitis treated with a cholecystostomy tube.  He is accompanied with his wife and his wife sister.  He still exhibits some degree of dementia but is pleasant.  He does report that he is tolerating liquids without any nausea or vomiting.  He denies any abdominal pain.  His wife corroborates the story and says he has not been reporting any abdominal pain.  He has not had any fevers.  He does have urinary retention and has a Foley in place.  His cholecystostomy tube is putting out  bile  Past Medical History:  Diagnosis Date   AAA (abdominal aortic aneurysm) (HCC)    Hypertension    PAD (peripheral artery disease) (HCC)    PAD with stent placed to artery in R leg   Renal disorder    stage 3 CKD    Past Surgical History:  Procedure Laterality Date   CATARACT EXTRACTION W/ INTRAOCULAR LENS IMPLANT Bilateral    COARCTATION OF AORTA REPAIR     IR PERC CHOLECYSTOSTOMY  05/02/2023    Family History  Problem Relation Age of Onset   Diabetes Brother     Social History:  reports that he has never smoked. He has never been exposed to tobacco smoke. He has never used smokeless tobacco. He reports that he does not currently use alcohol. He reports that he does not use drugs.  Allergies:  Allergies  Allergen Reactions   Meat Extract Itching, Other (See Comments) and Rash    Red meat   Rosuvastatin Other (See Comments)    SEVERE LEG PAIN   Allopurinol Other (See Comments)    Lethargy   Propofol Other (See Comments)    Other   Shellfish Allergy Other (See Comments)    According to wife. N/V only    Medications reviewed.    ROS Full ROS performed and is otherwise negative other than what is stated in HPI   BP 129/79   Pulse 75   Temp 98.2 F (36.8 C)   Ht  5\' 11"  (1.803 m)   Wt 155 lb 9.6 oz (70.6 kg)   SpO2 99%   BMI 21.70 kg/m   Physical Exam He is somewhat forgetful and repeats himself at times, moving all extremities spontaneously, PERRLA, no acute distress, abdomen is soft, nontender to palpation throughout.  He has a cholecystostomy tube that is putting out bile.  He has a small umbilical hernia.    No results found for this or any previous visit (from the past 48 hours). No results found.  Assessment/Plan:  Patient presents as a hospital follow-up after having cholecystitis that was treated with cholecystostomy tube.  Overall he is doing better.  He has had no fevers and his pain is improved.  He can liberalize his diet and eat what ever he wants.  We will plan for a cholangiogram through his cholecystostomy tube and 3-4  weeks from now and then he will follow-up in our office to discuss potential surgery.   Baker Pierini, M.D. [facility]

## 2023-05-25 NOTE — Progress Notes (Unsigned)
 05/27/2023 9:32 PM   Adrian Aguilar [birth year] [id number]  Referring provider: Langley Gauss, MD [address] [place],  [place] [address]  Urological history: 1. BPH with retention -Occurred during hospitalization in January for altered mental status, fecal impaction and cholecystitis -Failed voiding trial x 2 -Tamsulosin 0.4 mg twice daily  No chief complaint on file.  HPI: Adrian Aguilar is a 89 y.o. male who presents today for TOV.   Previous records reviewed.   PMH: Past Medical History:  Diagnosis Date   AAA (abdominal aortic aneurysm) (HCC)    Hypertension    PAD (peripheral artery disease) (HCC)    PAD with stent placed to artery in R leg   Renal disorder    stage 3 CKD    Surgical History: Past Surgical History:  Procedure Laterality Date   CATARACT EXTRACTION W/ INTRAOCULAR LENS IMPLANT Bilateral    COARCTATION OF AORTA REPAIR     IR PERC CHOLECYSTOSTOMY  05/02/2023    Home Medications:  Allergies as of 05/27/2023       Reactions   Meat Extract Itching, Other (See Comments), Rash   Red meat   Rosuvastatin Other (See Comments)   SEVERE LEG PAIN   Allopurinol Other (See Comments)   Lethargy   Propofol Other (See Comments)   Other   Shellfish Allergy Other (See Comments)   According to wife. N/V only        Medication List        Accurate as of May 25, 2023  9:32 PM. If you have any questions, ask your nurse or doctor.          Apoaequorin 10 MG Caps Take 1 tablet by mouth daily.   ascorbic acid 1000 MG tablet Commonly known as: VITAMIN C Take 1,000 mg by mouth daily.   aspirin EC 81 MG tablet Take 81 mg by mouth daily. Swallow whole.   colchicine 0.6 MG tablet Take 0.5 tablets (0.3 mg total) by mouth daily.   febuxostat 40 MG tablet Commonly known as: ULORIC Take 40 mg by mouth daily.   fluticasone 50 MCG/ACT nasal spray Commonly known as: FLONASE Place 2 sprays into both nostrils  daily as needed for rhinitis or allergies.   lisinopril 20 MG tablet Commonly known as: ZESTRIL Take 20 mg by mouth daily.   nystatin powder Commonly known as: MYCOSTATIN/NYSTOP Apply 1 Application topically 3 (three) times daily.   polyethylene glycol 17 g packet Commonly known as: MiraLax Take 17 g by mouth daily.   Praluent 75 MG/ML Soaj Generic drug: Alirocumab Inject 75 mg into the skin as directed. 75 mg under skin every 14 days   senna-docusate 8.6-50 MG tablet Commonly known as: Senokot-S Take 2 tablets by mouth at bedtime.   sodium chloride flush 0.9 % Soln Commonly known as: NS Inject 5 mLs into the vein daily. Flush gallbladder drain daily with 5 ccs of normal saline   tamsulosin 0.4 MG Caps capsule Commonly known as: FLOMAX Take 1 capsule (0.4 mg total) by mouth daily.        Allergies:  Allergies  Allergen Reactions   Meat Extract Itching, Other (See Comments) and Rash    Red meat   Rosuvastatin Other (See Comments)    SEVERE LEG PAIN   Allopurinol Other (See Comments)    Lethargy   Propofol Other (See Comments)    Other   Shellfish Allergy Other (See Comments)    According to wife. N/V only  Family History: Family History  Problem Relation Age of Onset   Diabetes Brother     Social History:  reports that he has never smoked. He has never been exposed to tobacco smoke. He has never used smokeless tobacco. He reports that he does not currently use alcohol. He reports that he does not use drugs.  ROS: Pertinent ROS in HPI  Physical Exam: There were no vitals taken for this visit.  Constitutional:  Well nourished. Alert and oriented, No acute distress. HEENT: [place] AT, moist mucus membranes.  Trachea midline, no masses. Cardiovascular: No clubbing, cyanosis, or edema. Respiratory: Normal respiratory effort, no increased work of breathing. GI: Abdomen is soft, non tender, non distended, no abdominal masses. Liver and spleen not palpable.  No  hernias appreciated.  Stool sample for occult testing is not indicated.   GU: No CVA tenderness.  No bladder fullness or masses.  Patient with circumcised/uncircumcised phallus. ***Foreskin easily retracted***  Urethral meatus is patent.  No penile discharge. No penile lesions or rashes. Scrotum without lesions, cysts, rashes and/or edema.  Testicles are located scrotally bilaterally. No masses are appreciated in the testicles. Left and right epididymis are normal. Rectal: Patient with  normal sphincter tone. Anus and perineum without scarring or rashes. No rectal masses are appreciated. Prostate is approximately *** grams, *** nodules are appreciated. Seminal vesicles are normal. Skin: No rashes, bruises or suspicious lesions. Lymph: No cervical or inguinal adenopathy. Neurologic: Grossly intact, no focal deficits, moving all 4 extremities. Psychiatric: Normal mood and affect.   Laboratory Data: N/A  Pertinent Imaging: ***   Assessment & Plan:    1. BPH with retention  -Patient failed voiding trial  -Patient tolerated tamsulosin 0.4 mg daily, so we will increase it to tamsulosin twice daily and attempt another voiding trial next week  No follow-ups on file.  These notes generated with voice recognition software. I apologize for typographical errors.  Cloretta Ned  [facility] [address]  [address] [place], [place] [address] [phone]

## 2023-05-25 NOTE — Patient Instructions (Addendum)
 You will need to be seen by Interventional Radiology the week of March 9th for a drain check.  We have called them and left a message about scheduling this appointment.  You may call them at [phone].  Follow-up with our office after this test to discuss the next steps.   Please call and ask to speak with a nurse if you develop questions or concerns.   Cholecystitis Cholecystitis is irritation and swelling (inflammation) of the gallbladder. The gallbladder: Is an organ that is shaped like a pear. Is under the liver on the right side of the body. Stores bile. Bile helps the body break down (digest) the fats in food. This condition can occur all of a sudden. It needs to be treated. What are the causes? This condition may be caused by stones or lumps that form in the gallbladder (gallstones). Gallstones can block the tube (duct) that carries bile out of your gallbladder. Other causes include: Damage to the gallbladder due to less blood flow. Germs in the bile ducts. Scars, kinks, or adhesions in the bile ducts. Abnormal growths (tumors) in the liver, pancreas, or gallbladder. What increases the risk? You are more likely to develop this condition if: You are male and between the ages of 70-62. You take birth control pills. You use estrogen. You take certain medicines that make you more likely to develop gallstones. You are overweight (obese). You have a very bad reaction to an infection (sepsis). You have been hospitalized due to a serious condition, such as a burn or illness. You have not eaten or drank for a long time. What are the signs or symptoms? Symptoms of this condition include: Pain in the upper right part of the belly (abdomen). A lump over the gallbladder. Bloating in the belly. Feeling sick to your stomach (nauseous). Vomiting. Fever. Chills. How is this treated? This condition may be treated with: Medicines to treat pain. Giving fluids through an IV  tube. Not eating or drinking (fasting). Antibiotic medicines. Surgery to take out your gallbladder. Gallbladder drainage. Follow these instructions at home: Medicines  Take over-the-counter and prescription medicines only as told by your doctor. If you were prescribed an antibiotic medicine, take it as told by your doctor. Do not stop taking it even if you start to feel better. General instructions Follow instructions from your doctor about what to eat or drink. Do not eat or drink anything that makes you sick again. Do not smoke or use any products that contain nicotine or tobacco. If you need help quitting, ask your doctor. Keep all follow-up visits. Contact a doctor if: You have pain and your medicine does not help. You have a fever. Get help right away if: Your pain moves to: Another part of your belly. Your back. Your symptoms do not go away. You have new symptoms. These symptoms may be an emergency. Get help right away. Call 911. Do not wait to see if the symptoms will go away. Do not drive yourself to the hospital. Summary This condition may be caused by stones or lumps that form in the gallbladder (gallstones). A common symptom is pain in your belly. This condition may be treated with surgery to take out your gallbladder. Follow instructions from your doctor about what to eat or drink. This information is not intended to replace advice given to you by your health care provider. Make sure you discuss any questions you have with your health care provider. Document Revised: 09/24/2020 Document Reviewed: 09/24/2020 Elsevier Patient Education  2024 [employer]

## 2023-05-26 ENCOUNTER — Telehealth: Payer: Self-pay

## 2023-05-26 NOTE — Telephone Encounter (Signed)
 Incoming message on triage line from patient stating that there is blood in his foley and questions if this is normal.  Called pt, wife answers (listed on DPR). Inquired about the color of the urine wife states that the urine is mostly yellow, but notes that there was a few bright red streaks around the catheter. The wife denies clots in urine, fever, chills pain. Encouraged patient to increase hydration, blood likely caused by urethral irrigation. Patient scheduled for catheter removal tomorrow.

## 2023-05-27 ENCOUNTER — Ambulatory Visit: Payer: Medicare Other | Admitting: Urology

## 2023-05-27 ENCOUNTER — Encounter: Payer: Self-pay | Admitting: Urology

## 2023-05-27 VITALS — BP 157/76 | HR 116

## 2023-05-27 DIAGNOSIS — N401 Enlarged prostate with lower urinary tract symptoms: Secondary | ICD-10-CM | POA: Diagnosis not present

## 2023-05-27 DIAGNOSIS — R338 Other retention of urine: Secondary | ICD-10-CM

## 2023-05-27 DIAGNOSIS — N138 Other obstructive and reflux uropathy: Secondary | ICD-10-CM

## 2023-05-27 LAB — BLADDER SCAN AMB NON-IMAGING: Scan Result: 287

## 2023-05-27 NOTE — Progress Notes (Signed)
Catheter Removal  Patient is present today for a catheter removal.  10 ml of water was drained from the balloon. A 16FR foley cath was removed from the bladder, no complications were noted. Patient tolerated well.  Performed by: Humberta Magallon-Mariche, RMA  

## 2023-05-27 NOTE — Progress Notes (Unsigned)
 05/28/2023 9:26 AM   Adrian Aguilar 1932/04/11 [id number]  Referring provider: Langley Gauss, MD [address] [place],  [place] [address]  Urological history: 1. BPH with retention -Occurred during hospitalization in January for altered mental status, fecal impaction and cholecystitis -Failed voiding trial x 2 -Tamsulosin 0.4 mg twice daily  Chief Complaint  Patient presents with   Follow-up   HPI: Adrian Aguilar is a 89 y.o. male who presents today for TOV with his wife, Stanton Kidney, and his sister-in-law.   Previous records reviewed.   He has dementia and is unable to contribute to history.  His wife states that he was pacing the floor all night attempting to void and he had been unsuccessful.  He is complaining of penile pain.  His PVR 761 mL.  He is also developed diarrhea overnight.  The wife states that she is still giving him his stool softeners.  Patient denies any modifying or aggravating factors.  Patient denies any recent UTI's, gross hematuria or suprapubic/flank pain.  Patient denies any fevers, chills, nausea or vomiting.    I replaced the Foley catheter and the urine appeared purulent.   He is very lethargic during today's visit, but arousable.    PMH: Past Medical History:  Diagnosis Date   AAA (abdominal aortic aneurysm) (HCC)    Hypertension    PAD (peripheral artery disease) (HCC)    PAD with stent placed to artery in R leg   Renal disorder    stage 3 CKD    Surgical History: Past Surgical History:  Procedure Laterality Date   CATARACT EXTRACTION W/ INTRAOCULAR LENS IMPLANT Bilateral    COARCTATION OF AORTA REPAIR     IR PERC CHOLECYSTOSTOMY  05/02/2023    Home Medications:  Allergies as of 05/28/2023       Reactions   Meat Extract Itching, Other (See Comments), Rash   Red meat   Rosuvastatin Other (See Comments)   SEVERE LEG PAIN   Allopurinol Other (See Comments)   Lethargy   Propofol Other (See Comments)    Other   Shellfish Allergy Other (See Comments)   According to wife. N/V only        Medication List        Accurate as of May 28, 2023  9:26 AM. If you have any questions, ask your nurse or doctor.          Apoaequorin 10 MG Caps Take 1 tablet by mouth daily.   ascorbic acid 1000 MG tablet Commonly known as: VITAMIN C Take 1,000 mg by mouth daily.   aspirin EC 81 MG tablet Take 81 mg by mouth daily. Swallow whole.   cefUROXime 250 MG tablet Commonly known as: CEFTIN Take 1 tablet (250 mg total) by mouth 2 (two) times daily with a meal. Started by: Carollee Herter Kamilla Hands   colchicine 0.6 MG tablet Take 0.5 tablets (0.3 mg total) by mouth daily.   febuxostat 40 MG tablet Commonly known as: ULORIC Take 40 mg by mouth daily.   fluticasone 50 MCG/ACT nasal spray Commonly known as: FLONASE Place 2 sprays into both nostrils daily as needed for rhinitis or allergies.   lisinopril 20 MG tablet Commonly known as: ZESTRIL Take 20 mg by mouth daily.   nystatin powder Commonly known as: MYCOSTATIN/NYSTOP Apply 1 Application topically 3 (three) times daily.   polyethylene glycol 17 g packet Commonly known as: MiraLax Take 17 g by mouth daily.   Praluent 75 MG/ML Soaj Generic  drug: Alirocumab Inject 75 mg into the skin as directed. 75 mg under skin every 14 days   senna-docusate 8.6-50 MG tablet Commonly known as: Senokot-S Take 2 tablets by mouth at bedtime.   sodium chloride flush 0.9 % Soln Commonly known as: NS Inject 5 mLs into the vein daily. Flush gallbladder drain daily with 5 ccs of normal saline   tamsulosin 0.4 MG Caps capsule Commonly known as: FLOMAX Take 1 capsule (0.4 mg total) by mouth daily.        Allergies:  Allergies  Allergen Reactions   Meat Extract Itching, Other (See Comments) and Rash    Red meat   Rosuvastatin Other (See Comments)    SEVERE LEG PAIN   Allopurinol Other (See Comments)    Lethargy   Propofol Other (See  Comments)    Other   Shellfish Allergy Other (See Comments)    According to wife. N/V only    Family History: Family History  Problem Relation Age of Onset   Diabetes Brother     Social History:  reports that he has never smoked. He has never been exposed to tobacco smoke. He has never used smokeless tobacco. He reports that he does not currently use alcohol. He reports that he does not use drugs.  ROS: Pertinent ROS in HPI  Physical Exam: BP (!) 161/74   Pulse (!) 102   Temp (!) 101.2 F (38.4 C) (Axillary)   Ht 5\' 11"  (1.803 m)   Wt 155 lb (70.3 kg)   BMI 21.62 kg/m   Constitutional:  Frail and elderly.  Lethargic.  Ill appearing.  HEENT: [place] AT, moist mucus membranes.  Trachea midline Cardiovascular: No clubbing, cyanosis, or edema. Respiratory: Normal respiratory effort, no increased work of breathing. GU: No CVA tenderness.  No bladder fullness or masses.  Patient with uncircumcised phallus.  Foreskin easily retracted  Urethral meatus is patent.  No penile discharge. No penile lesions or rashes.  Foley in place draining purulent urine.   Neurologic: Grossly intact, no focal deficits, moving all 4 extremities. Psychiatric: Normal mood and affect.   Laboratory Data: N/A  Pertinent Imaging:  05/28/23 08:13  Scan Result   Procedure: Simple Catheter Placement Due to urinary retention patient is present today for a foley cath placement.  Patient was cleaned and prepped in a sterile fashion with betadine and 2% lidocaine jelly was instilled into the urethra. A 18 FR Coude foley catheter was inserted, urine return was noted  800 ml, urine was yellow milky in color.  The balloon was filled with 10cc of sterile water.  A leg bag was attached for drainage. Patient was also given a night bag to take home and was given instruction on how to change from one bag to another.  Patient was given instruction on proper catheter care.  Patient tolerated well, no complications were  noted   Performed by: Michiel Cowboy, PA-C and Peri Maris, CMA    Assessment & Plan:    1. BPH with retention  -he is tolerating tamsulosin 0.4 mg twice daily -Foley catheter replaced -schedule cystoscopy with Dr. Apolinar Junes to evaluate for BOO -Explained that the cystoscopy is a safe and common diagnostic test performed by one of our physicians in the office.  It consist of using a thin, lighted tube to look directly inside the bladder, prostate  and urethra to evaluate the anatomy.  The procedure is brief, typically taking about 5 minutes. -This will enable [place] to assess bladder health,  diagnose and enlarged prostate, assess which BPH procedure may be most appropriate and rule out other bladder conditions (stricture disease, stones, cancer, etc.)  -Advised the patient that there are no restrictions to eating or drinking prior to the cystoscopy -They can continue to take all of their medications as prescribed -They can drive themselves to and from the appointment -I explained that during the procedure, the area around the urethra will be cleansed thoroughly, topical anesthetic will be applied to numb your urethra, the thin tube is then gently inserted through the urethra into your bladder while fluid flows through the tube to the bladder to enable better visualization -I explained the procedure is usually not painful, however there may be some discomfort (pinching feeling), and they may feel an urge to urinate, coolness or fullness in the bladder and then the cystoscope is removed -After the cystoscopy, I advised them that they may experience urinary frequency, hematuria, dysuria which will resolve within 24 to 48 hours -Reviewed red flag signs (fever, bright red blood or blood clots in the urine, abdominal pain or difficulty urinating) and to contact the office immediately or seek treatment in the ED if they should experience any of these -The physician will discuss the results of the  cystoscopy at the time of the procedure   2. Suspected UTI with concern for sepsis  -urine purulent -Urine culture pending -Rocephin 1 g IM given in the office -Patient very lethargic during office visit -Patient transferred to the ED for further evaluation and management  Return for transferred to the ED .  These notes generated with voice recognition software. I apologize for typographical errors.  Cloretta Ned  [facility] [address]  [address] [place], [place] [address] [phone]  I spent 60 minutes on the day of the encounter to include pre-visit record review, face-to-face time with the patient, and post-visit ordering of tests.

## 2023-05-28 ENCOUNTER — Encounter: Payer: Self-pay | Admitting: Urology

## 2023-05-28 ENCOUNTER — Emergency Department
Admission: EM | Admit: 2023-05-28 | Discharge: 2023-05-28 | Disposition: A | Discharge status: Home / Self Care | Payer: Medicare Other | Attending: Emergency Medicine | Admitting: Emergency Medicine

## 2023-05-28 ENCOUNTER — Ambulatory Visit (INDEPENDENT_AMBULATORY_CARE_PROVIDER_SITE_OTHER): Payer: Medicare Other | Admitting: Urology

## 2023-05-28 ENCOUNTER — Other Ambulatory Visit: Payer: Self-pay

## 2023-05-28 ENCOUNTER — Emergency Department: Payer: Medicare Other

## 2023-05-28 VITALS — BP 161/74 | HR 102 | Temp 101.2°F | Ht 71.0 in | Wt 155.0 lb

## 2023-05-28 DIAGNOSIS — F0390 Unspecified dementia without behavioral disturbance: Secondary | ICD-10-CM | POA: Diagnosis not present

## 2023-05-28 DIAGNOSIS — N189 Chronic kidney disease, unspecified: Secondary | ICD-10-CM | POA: Insufficient documentation

## 2023-05-28 DIAGNOSIS — R339 Retention of urine, unspecified: Secondary | ICD-10-CM | POA: Insufficient documentation

## 2023-05-28 DIAGNOSIS — E871 Hypo-osmolality and hyponatremia: Secondary | ICD-10-CM | POA: Insufficient documentation

## 2023-05-28 DIAGNOSIS — N401 Enlarged prostate with lower urinary tract symptoms: Secondary | ICD-10-CM | POA: Diagnosis not present

## 2023-05-28 DIAGNOSIS — R8281 Pyuria: Secondary | ICD-10-CM

## 2023-05-28 DIAGNOSIS — I129 Hypertensive chronic kidney disease with stage 1 through stage 4 chronic kidney disease, or unspecified chronic kidney disease: Secondary | ICD-10-CM | POA: Insufficient documentation

## 2023-05-28 DIAGNOSIS — N3090 Cystitis, unspecified without hematuria: Secondary | ICD-10-CM | POA: Diagnosis not present

## 2023-05-28 DIAGNOSIS — R509 Fever, unspecified: Secondary | ICD-10-CM | POA: Diagnosis present

## 2023-05-28 LAB — CBC
HCT: 33 % — ABNORMAL LOW (ref 39.0–52.0)
Hemoglobin: 12 g/dL — ABNORMAL LOW (ref 13.0–17.0)
MCH: 29.5 pg (ref 26.0–34.0)
MCHC: 36.4 g/dL — ABNORMAL HIGH (ref 30.0–36.0)
MCV: 81.1 fL (ref 80.0–100.0)
Platelets: 369 10*3/uL (ref 150–400)
RBC: 4.07 MIL/uL — ABNORMAL LOW (ref 4.22–5.81)
RDW: 12.9 % (ref 11.5–15.5)
WBC: 24.7 10*3/uL — ABNORMAL HIGH (ref 4.0–10.5)
nRBC: 0 % (ref 0.0–0.2)

## 2023-05-28 LAB — BASIC METABOLIC PANEL
Anion gap: 10 (ref 5–15)
BUN: 20 mg/dL (ref 8–23)
CO2: 23 mmol/L (ref 22–32)
Calcium: 9.1 mg/dL (ref 8.9–10.3)
Chloride: 90 mmol/L — ABNORMAL LOW (ref 98–111)
Creatinine, Ser: 1.52 mg/dL — ABNORMAL HIGH (ref 0.61–1.24)
GFR, Estimated: 44 mL/min — ABNORMAL LOW (ref >60)
Glucose, Bld: 167 mg/dL — ABNORMAL HIGH (ref 70–99)
Potassium: 4.5 mmol/L (ref 3.5–5.1)
Sodium: 123 mmol/L — ABNORMAL LOW (ref 135–145)

## 2023-05-28 LAB — URINALYSIS, W/ REFLEX TO CULTURE (INFECTION SUSPECTED)
Bilirubin Urine: NEGATIVE
Glucose, UA: NEGATIVE mg/dL
Ketones, ur: NEGATIVE mg/dL
Nitrite: NEGATIVE
Protein, ur: 100 mg/dL — AB
Specific Gravity, Urine: 1.004 — ABNORMAL LOW (ref 1.005–1.030)
Squamous Epithelial / HPF: 0 /[HPF] (ref 0–5)
WBC, UA: >50 WBC/hpf (ref 0–5)
pH: 6 (ref 5.0–8.0)

## 2023-05-28 LAB — BLADDER SCAN AMB NON-IMAGING

## 2023-05-28 MED ORDER — CEFTRIAXONE SODIUM 500 MG IJ SOLR
1000.0000 mg | Freq: Once | INTRAMUSCULAR | Status: AC
  Administered 2023-05-28: 1000 mg via INTRAMUSCULAR

## 2023-05-28 MED ORDER — CEFDINIR 300 MG PO CAPS
300.0000 mg | ORAL_CAPSULE | Freq: Two times a day (BID) | ORAL | 0 refills | Status: AC
Start: 2023-05-28 — End: 2023-06-04

## 2023-05-28 MED ORDER — CEFUROXIME AXETIL 250 MG PO TABS: 250.0000 mg | ORAL_TABLET | Freq: Two times a day (BID) | ORAL | 0 refills | Status: DC

## 2023-05-28 NOTE — ED Triage Notes (Addendum)
 Pt comes from urology office with c/o possible sepsis. Pt has been having issues with urinary retention. Pt had cath out yesterday and back to office today and had fever. Cath was replaced today. Pt advised to come here for further check. Pt has also had gallbladder cath in place.  Pt has also had diarrhea. Pt not able to sleep due to all issues. Pt falling asleep in triage.

## 2023-05-28 NOTE — ED Provider Notes (Signed)
 [facility] Provider Note    Event Date/Time   First MD Initiated Contact with Patient 05/28/23 [phone]     (approximate)   History   Chief Complaint: Fever   HPI  Adrian Aguilar is a 89 y.o. male with a history of hypertension CKD dementia BPH who was sent to the ED from urology clinic for fever.  Patient has history of urinary retention, Foley catheter was removed yesterday, he was not able to void and went back to clinic today where PVR was over 700.  New Foley catheter was inserted which revealed grossly purulent urine.  Culture was sent from the clinic according to notes.  In the clinic a body temperature was recorded at 101 so he was sent to the ED for evaluation.  Wife at bedside reports that mental status is baseline.  She does not think that he is somnolent or confused.  She reports that he is tired due to frequent trips to the bathroom last night and being unable to sleep but now he is resting since he was not able to last night.  Reports that he has been eating and drinking well.  She also notes that they have home nurse services and feels comfortable with treating him at home if possible.          Physical Exam   Triage Vital Signs: ED Triage Vitals  Encounter Vitals Group     BP 05/28/23 0939 (!) 150/62     Systolic BP Percentile --      Diastolic BP Percentile --      Pulse Rate 05/28/23 0939 85     Resp 05/28/23 0939 19     Temp 05/28/23 0939 98.2 F (36.8 C)     Temp src --      SpO2 05/28/23 0939 100 %     Weight 05/28/23 0937 155 lb (70.3 kg)     Height 05/28/23 0937 5\' 11"  (1.803 m)     Head Circumference --      Peak Flow --      Pain Score 05/28/23 0937 4     Pain Loc --      Pain Education --      Exclude from Growth Chart --     Most recent vital signs: Vitals:   05/28/23 0939  BP: (!) 150/62  Pulse: 85  Resp: 19  Temp: 98.2 F (36.8 C)  SpO2: 100%    General: Awake, no distress.  CV:  Good peripheral  perfusion.  Regular rate rhythm Resp:  Normal effort.  Clear to auscultation bilaterally Abd:  No distention.  Soft with diffuse lower abdominal tenderness Other:  Foley catheter in place, purulent urine in the collection bag.  Cholecystostomy tube in place with clear bilious drainage.   ED Results / Procedures / Treatments   Labs (all labs ordered are listed, but only abnormal results are displayed) Labs Reviewed  BASIC METABOLIC PANEL - Abnormal; Notable for the following components:      Result Value   Sodium 123 (*)    Chloride 90 (*)    Glucose, Bld 167 (*)    Creatinine, Ser 1.52 (*)    GFR, Estimated 44 (*)    All other components within normal limits  CBC - Abnormal; Notable for the following components:   WBC 24.7 (*)    RBC 4.07 (*)    Hemoglobin 12.0 (*)    HCT 33.0 (*)    MCHC 36.4 (*)  All other components within normal limits  URINALYSIS, W/ REFLEX TO CULTURE (INFECTION SUSPECTED) - Abnormal; Notable for the following components:   Color, Urine YELLOW (*)    APPearance TURBID (*)    Specific Gravity, Urine 1.004 (*)    Hgb urine dipstick MODERATE (*)    Protein, ur 100 (*)    Leukocytes,Ua MODERATE (*)    Bacteria, UA MANY (*)    All other components within normal limits  URINE CULTURE  CBG MONITORING, ED     EKG    RADIOLOGY CT abdomen pelvis interpreted by me, no ureteral obstruction.  Radiology report reviewed, no signs of pyelonephritis   PROCEDURES:  Procedures   MEDICATIONS ORDERED IN ED: Medications - No data to display   IMPRESSION / MDM / ASSESSMENT AND PLAN / ED COURSE  I reviewed the triage vital signs and the nursing notes.  DDx: UTI, AKI, electrolyte derangement, kidney stone  Patient's presentation is most consistent with acute presentation with potential threat to life or bodily function.  Patient sent to the ED for evaluation of clinically apparent cystitis.  Vital signs in the ED are normal without antipyretics.  Not  consistent with sepsis.  Will check labs, CT abdomen pelvis.   ----------------------------------------- 1:46 PM on 05/28/2023 ----------------------------------------- Results confirm cystitis, hyponatremia of 123 compared to baseline of about 130.  Results discussed with spouse and the patient.  Patient is awake and alert, denies any acute complaints currently.  Spouse reiterates feeling comfortable treating this at home.  Return precautions discussed.  Prescription for [facility] sent to pharmacy.      FINAL CLINICAL IMPRESSION(S) / ED DIAGNOSES   Final diagnoses:  Cystitis  Urinary retention  Hyponatremia     Rx / DC Orders   ED Discharge Orders          Ordered    cefdinir (OMNICEF) 300 MG capsule  2 times daily        05/28/23 1344             Note:  This document was prepared using Dragon voice recognition software and may include unintentional dictation errors.   Sharman Cheek, MD 05/28/23 1346

## 2023-05-30 LAB — URINE CULTURE: Culture: >=100000 — AB

## 2023-05-31 ENCOUNTER — Telehealth: Payer: Self-pay | Admitting: Urology

## 2023-05-31 NOTE — Telephone Encounter (Signed)
 Pt was sent to the ER last Friday for a fever and he didn't have a fever in the ER it was 98.7.

## 2023-06-03 LAB — CULTURE, URINE COMPREHENSIVE

## 2023-06-09 ENCOUNTER — Other Ambulatory Visit: Payer: Medicare Other

## 2023-06-10 ENCOUNTER — Ambulatory Visit
Admission: RE | Admit: 2023-06-10 | Discharge: 2023-06-10 | Disposition: A | Discharge status: Home / Self Care | Payer: Medicare Other | Source: Ambulatory Visit | Attending: General Surgery | Admitting: General Surgery

## 2023-06-10 DIAGNOSIS — K810 Acute cholecystitis: Secondary | ICD-10-CM

## 2023-06-10 HISTORY — PX: IR RADIOLOGIST EVAL & MGMT: IMG5224

## 2023-06-10 NOTE — Progress Notes (Signed)
 Chief Complaint: Patient was seen in consultation today for chole tube in place at the request of Dennis,Samuel O  Referring Physician(s): Dennis,Samuel O  History of Present Illness: Adrian Aguilar is a 89 y.o. male with a history of acute cholecystitis necessitating percutaneous cholecystostomy tube placement on May 02, 2023.  At that time, his ultrasound demonstrated extensive sludge and evidence of acute cholecystitis.  No large stones were visible.  Patient presents today for follow-up and drain injection.  Drain injection demonstrates a contracted gallbladder with multiple small filling defects.  The cystic duct remains completely occluded.  No visualization of the biliary tree.  Past Medical History:  Diagnosis Date   AAA (abdominal aortic aneurysm) (HCC)    Hypertension    PAD (peripheral artery disease) (HCC)    PAD with stent placed to artery in R leg   Renal disorder    stage 3 CKD    Past Surgical History:  Procedure Laterality Date   CATARACT EXTRACTION W/ INTRAOCULAR LENS IMPLANT Bilateral    COARCTATION OF AORTA REPAIR     IR PERC CHOLECYSTOSTOMY  05/02/2023   IR RADIOLOGIST EVAL & MGMT  06/10/2023    Allergies: Meat extract, Rosuvastatin, Allopurinol, Propofol, and Shellfish allergy  Medications: Prior to Admission medications   Medication Sig Start Date End Date Taking? Authorizing Provider  Apoaequorin 10 MG CAPS Take 1 tablet by mouth daily.    [provider]  ascorbic acid (VITAMIN C) 1000 MG tablet Take 1,000 mg by mouth daily. 12/10/17   [provider]  aspirin EC 81 MG tablet Take 81 mg by mouth daily. Swallow whole.    [provider]  cefUROXime (CEFTIN) 250 MG tablet Take 1 tablet (250 mg total) by mouth 2 (two) times daily with a meal. 05/28/23   McGowan, Carollee Herter A, PA-C  colchicine 0.6 MG tablet Take 0.5 tablets (0.3 mg total) by mouth daily. 04/22/23   Loyce Dys, MD  febuxostat (ULORIC) 40 MG tablet Take 40  mg by mouth daily.    [provider]  fluticasone (FLONASE) 50 MCG/ACT nasal spray Place 2 sprays into both nostrils daily as needed for rhinitis or allergies. 07/15/18   [provider]  lisinopril (ZESTRIL) 20 MG tablet Take 20 mg by mouth daily.    [provider]  nystatin (MYCOSTATIN/NYSTOP) powder Apply 1 Application topically 3 (three) times daily. 05/13/23   Vanna Scotland, MD  polyethylene glycol (MIRALAX) 17 g packet Take 17 g by mouth daily. 04/18/23   Osvaldo Shipper, MD  PRALUENT 75 MG/ML SOAJ Inject 75 mg into the skin as directed. 75 mg under skin every 14 days    [provider]  senna-docusate (SENOKOT-S) 8.6-50 MG tablet Take 2 tablets by mouth at bedtime. 04/18/23   Osvaldo Shipper, MD  sodium chloride flush (NS) 0.9 % SOLN Inject 5 mLs into the vein daily. Flush gallbladder drain daily with 5 ccs of normal saline 05/04/23 06/15/23  Donovan Kail, PA-C  tamsulosin (FLOMAX) 0.4 MG CAPS capsule Take 1 capsule (0.4 mg total) by mouth daily. 05/13/23   Harle Battiest, PA-C     Family History  Problem Relation Age of Onset   Diabetes Brother     Social History   Socioeconomic History   Marital status: Unknown    Spouse name: Not on file   Number of children: Not on file   Years of education: Not on file   Highest education level: Not on file  Occupational  History   Not on file  Tobacco Use   Smoking status: Never    Passive exposure: Never   Smokeless tobacco: Never  Substance and Sexual Activity   Alcohol use: Not Currently   Drug use: Never   Sexual activity: Not Currently  Other Topics Concern   Not on file  Social History Narrative   Not on file   Social Drivers of Health   Financial Resource Strain: Low Risk  (05/07/2023)   Received from [facility]   Overall Financial Resource Strain (CARDIA)    Difficulty of Paying Living Expenses: Not hard at all  Food Insecurity: No Food Insecurity (05/07/2023)   Received from  [facility]   Hunger Vital Sign    Worried About Running Out of Food in the Last Year: Never true    Ran Out of Food in the Last Year: Never true  Transportation Needs: No Transportation Needs (05/07/2023)   Received from [facility] - Transportation    Lack of Transportation (Medical): No    Lack of Transportation (Non-Medical): No  Physical Activity: Unknown (05/07/2023)   Received from [facility]   Exercise Vital Sign    Days of Exercise per Week: Patient declined    Minutes of Exercise per Session: 10 min  Stress: No Stress Concern Present (05/07/2023)   Received from [facility] - Occupational Stress Questionnaire    Feeling of Stress : Not at all  Social Connections: Moderately Integrated (05/07/2023)   Received from [facility]   Social Network    How would you rate your social network (family, work, friends)?: Adequate participation with social networks  Recent Concern: Social Connections - Moderately Isolated (04/20/2023)   Social Connection and Isolation Panel [NHANES]    Frequency of Communication with Friends and Family: Once a week    Frequency of Social Gatherings with Friends and Family: Once a week    Attends Religious Services: 1 to 4 times per year    Active Member of [employer] or Organizations: No    Attends Banker Meetings: Never    Marital Status: Married   Review of Systems: A 12 point ROS discussed and pertinent positives are indicated in the HPI above.  All other systems are negative.  Review of Systems  Vital Signs: There were no vitals taken for this visit.  Advance Care Plan: The advanced care plan/surrogate decision maker was discussed at the time of visit and the patient did not wish to discuss or was not able to name a surrogate decision maker or provide an advance care plan.    Physical Exam Constitutional:      General: He is not in acute distress.    Appearance: Normal  appearance. He is normal weight.  HENT:     Head: Normocephalic and atraumatic.  Eyes:     General: No scleral icterus. Cardiovascular:     Rate and Rhythm: Normal rate.  Pulmonary:     Effort: Pulmonary effort is normal.  Abdominal:     General: There is no distension.     Palpations: Abdomen is soft.     Tenderness: There is no abdominal tenderness.    Skin:    General: Skin is warm and dry.  Neurological:     Mental Status: He is alert. Mental status is at baseline.       Imaging: IR Radiologist Eval & Mgmt Result Date: 06/10/2023 EXAM: NEW PATIENT OFFICE VISIT  CHIEF COMPLAINT: SEE EPIC NOTE HISTORY OF PRESENT ILLNESS: SEE EPIC NOTE REVIEW OF SYSTEMS: SEE EPIC NOTE PHYSICAL EXAMINATION: SEE EPIC NOTE ASSESSMENT AND PLAN: SEE EPIC NOTE Electronically Signed   By: Malachy Moan M.D.   On: 06/10/2023 11:36   CT Renal Stone Study Result Date: 05/28/2023 CLINICAL DATA:  Fever. Possible sepsis. History of urinary retention with recent Foley catheter exchange. EXAM: CT ABDOMEN AND PELVIS WITHOUT CONTRAST TECHNIQUE: Multidetector CT imaging of the abdomen and pelvis was performed following the standard protocol without IV contrast. RADIATION DOSE REDUCTION: This exam was performed according to the departmental dose-optimization program which includes automated exposure control, adjustment of the mA and/or kV according to patient size and/or use of iterative reconstruction technique. COMPARISON:  CT abdomen pelvis dated May 01, 2023. FINDINGS: Lower chest: No acute abnormality. Hepatobiliary: No focal liver abnormality. Interval placement of a percutaneous cholecystostomy tube with now decompressed gallbladder. Resolved pericholecystic inflammation. No gallstones or biliary dilatation. Pancreas: Unremarkable. No pancreatic ductal dilatation or surrounding inflammatory changes. Spleen: Normal in size without focal abnormality. Adrenals/Urinary Tract: Adrenal glands are unremarkable.  Unchanged right renal simple cyst. No follow-up imaging is recommended. No renal calculi or hydronephrosis. The bladder is decompressed by Foley catheter, however there is greater than expected circumferential bladder wall thickening with prominent perivesical inflammatory change. Stomach/Bowel: Stomach is within normal limits. Appendix appears normal. No evidence of bowel wall thickening, distention, or inflammatory changes. Left-sided colonic diverticulosis. Vascular/Lymphatic: Unchanged 5.8 cm infrarenal abdominal aortic aneurysm status post stent grafting. Aortoiliac atherosclerotic vascular disease. No enlarged abdominal or pelvic lymph nodes. Reproductive: Unchanged prostatomegaly. Other: No free fluid or pneumoperitoneum. Musculoskeletal: No acute or significant osseous findings. IMPRESSION: 1. Findings suggestive of cystitis.  Correlate with urinalysis. 2. Decompressed gallbladder status post percutaneous cholecystostomy tube placement. Resolved pericholecystic inflammation. 3. Unchanged 5.8 cm infrarenal abdominal aortic aneurysm status post stent grafting. 4.  Aortic Atherosclerosis (ICD10-I70.0). Electronically Signed   By: Obie Dredge M.D.   On: 05/28/2023 12:00    Labs:  CBC: Recent Labs    05/02/23 0411 05/03/23 0536 05/04/23 0535 05/28/23 1056  WBC 31.7* 28.2* 13.9* 24.7*  HGB 13.8 11.3* 10.4* 12.0*  HCT 39.1 31.9* 29.2* 33.0*  PLT 370 367 357 369    COAGS: Recent Labs    04/17/23 1431 05/01/23 2137 05/02/23 0411  INR 1.0 1.1 1.3*  APTT  --  22*  --     BMP: Recent Labs    05/02/23 0411 05/03/23 0536 05/04/23 0535 05/28/23 1056  NA 129* 131* 133* 123*  K 4.5 4.1 3.4* 4.5  CL 97* 101 104 90*  CO2 22 21* 22 23  GLUCOSE 155* 108* 102* 167*  BUN 24* 33* 30* 20  CALCIUM 9.0 8.6* 8.4* 9.1  CREATININE 1.29* 1.63* 1.50* 1.52*  GFRNONAA 53* 40* 45* 44*    LIVER FUNCTION TESTS: Recent Labs    04/17/23 1431 05/01/23 2137 05/02/23 0411  BILITOT 0.9 1.3* 1.1   AST 27 21 18   ALT 21 18 15   ALKPHOS 85 71 62  PROT 8.2* 8.1 6.8  ALBUMIN 4.8 4.1 3.5    TUMOR MARKERS: No results for input(s): "AFPTM", "CEA", "CA199", "CHROMGRNA" in the last 8760 hours.  Assessment and Plan:  89 year old gentleman with a recent history of acute cholecystitis necessitating percutaneous cholecystostomy tube placement on 05/02/2023.  He is clinically recovered and doing well.  His drain is functioning normally.  Through the tube cholangiogram today demonstrates persistent complete occlusion of the cystic duct.  Hopefully  he will be a candidate for cholecystectomy.  1.) Follow-up with surgery as planned 2.) Return to clinic in 4 weeks for tube exchange unless cholecystectomy happens in the interim.  Thank you for this interesting consult.  I greatly enjoyed meeting Adrian Aguilar and look forward to participating in their care.  A copy of this report was sent to the requesting provider on this date.  Electronically Signed: Sterling Big 06/10/2023, 1:37 PM   I spent a total of  15 Minutes in face to face in clinical consultation, greater than 50% of which was counseling/coordinating care for percutaneous cholecystostomy tube.

## 2023-06-11 ENCOUNTER — Other Ambulatory Visit: Payer: Self-pay | Admitting: General Surgery

## 2023-06-11 DIAGNOSIS — K810 Acute cholecystitis: Secondary | ICD-10-CM

## 2023-06-14 ENCOUNTER — Encounter: Payer: Self-pay | Admitting: Surgery

## 2023-06-14 ENCOUNTER — Telehealth: Payer: Self-pay | Admitting: Surgery

## 2023-06-14 ENCOUNTER — Ambulatory Visit (INDEPENDENT_AMBULATORY_CARE_PROVIDER_SITE_OTHER): Payer: Medicare Other | Admitting: Surgery

## 2023-06-14 VITALS — BP 160/76 | HR 80 | Temp 98.2°F | Ht 71.0 in | Wt 151.0 lb

## 2023-06-14 DIAGNOSIS — K811 Chronic cholecystitis: Secondary | ICD-10-CM

## 2023-06-14 DIAGNOSIS — K8011 Calculus of gallbladder with chronic cholecystitis with obstruction: Secondary | ICD-10-CM

## 2023-06-14 NOTE — Telephone Encounter (Signed)
 Patient has been advised of Pre-Admission date/time, and Surgery date at [facility].  Surgery Date: 06/17/23     Preadmission Testing Date: 06/15/23 (phone 1p-4p)  Patient has been made aware to call [phone], between 1-3:00pm the day before surgery, to find out what time to arrive for surgery.

## 2023-06-14 NOTE — Patient Instructions (Signed)
You have requested to have your gallbladder removed. This will be done at [facility] with Dr. Pabon.  You will most likely be out of work 1-2 weeks for this surgery.  If you have FMLA or disability paperwork that needs filled out you may drop this off at our office or this can be faxed to [phone].  You will return after your post-op appointment with a lifting restriction for approximately 4 more weeks.  You will be able to eat anything you would like to following surgery. But, start by eating a bland diet and advance this as tolerated. The Gallbladder diet is below, please go as closely by this diet as possible prior to surgery to avoid any further attacks.  Please see the (blue)pre-care form that you have been given today. Our surgery scheduler will call you to verify surgery date and to go over information.   If you have any questions, please call our office.  Laparoscopic Cholecystectomy Laparoscopic cholecystectomy is surgery to remove the gallbladder. The gallbladder is located in the upper right part of the abdomen, behind the liver. It is a storage sac for bile, which is produced in the liver. Bile aids in the digestion and absorption of fats. Cholecystectomy is often done for inflammation of the gallbladder (cholecystitis). This condition is usually caused by a buildup of gallstones (cholelithiasis) in the gallbladder. Gallstones can block the flow of bile, and that can result in inflammation and pain. In severe cases, emergency surgery may be required. If emergency surgery is not required, you will have time to prepare for the procedure. Laparoscopic surgery is an alternative to open surgery. Laparoscopic surgery has a shorter recovery time. Your common bile duct may also need to be examined during the procedure. If stones are found in the common bile duct, they may be removed. LET YOUR HEALTH CARE PROVIDER KNOW ABOUT: Any allergies you have. All medicines you are taking,  including vitamins, herbs, eye drops, creams, and over-the-counter medicines. Previous problems you or members of your family have had with the use of anesthetics. Any blood disorders you have. Previous surgeries you have had.  Any medical conditions you have. RISKS AND COMPLICATIONS Generally, this is a safe procedure. However, problems may occur, including: Infection. Bleeding. Allergic reactions to medicines. Damage to other structures or organs. A stone remaining in the common bile duct. A bile leak from the cyst duct that is clipped when your gallbladder is removed. The need to convert to open surgery, which requires a larger incision in the abdomen. This may be necessary if your surgeon thinks that it is not safe to continue with a laparoscopic procedure. BEFORE THE PROCEDURE Ask your health care provider about: Changing or stopping your regular medicines. This is especially important if you are taking diabetes medicines or blood thinners. Taking medicines such as aspirin and ibuprofen. These medicines can thin your blood. Do not take these medicines before your procedure if your health care provider instructs you not to. Follow instructions from your health care provider about eating or drinking restrictions. Let your health care provider know if you develop a cold or an infection before surgery. Plan to have someone take you home after the procedure. Ask your health care provider how your surgical site will be marked or identified. You may be given antibiotic medicine to help prevent infection. PROCEDURE To reduce your risk of infection: Your health care team will wash or sanitize their hands. Your skin will be washed with soap. An IV   tube may be inserted into one of your veins. You will be given a medicine to make you fall asleep (general anesthetic). A breathing tube will be placed in your mouth. The surgeon will make several small cuts (incisions) in your abdomen. A thin,  lighted tube (laparoscope) that has a tiny camera on the end will be inserted through one of the small incisions. The camera on the laparoscope will send a picture to a TV screen (monitor) in the operating room. This will give the surgeon a good view inside your abdomen. A gas will be pumped into your abdomen. This will expand your abdomen to give the surgeon more room to perform the surgery. Other tools that are needed for the procedure will be inserted through the other incisions. The gallbladder will be removed through one of the incisions. After your gallbladder has been removed, the incisions will be closed with stitches (sutures), staples, or skin glue. Your incisions may be covered with a bandage (dressing). The procedure may vary among health care providers and hospitals. AFTER THE PROCEDURE Your blood pressure, heart rate, breathing rate, and blood oxygen level will be monitored often until the medicines you were given have worn off. You will be given medicines as needed to control your pain.   This information is not intended to replace advice given to you by your health care provider. Make sure you discuss any questions you have with your health care provider.   Document Released: 03/23/2005 Document Revised: 12/12/2014 Document Reviewed: 11/02/2012 Elsevier Interactive Patient Education 2016 Elsevier Inc.   Low-Fat Diet for Gallbladder Conditions A low-fat diet can be helpful if you have pancreatitis or a gallbladder condition. With these conditions, your pancreas and gallbladder have trouble digesting fats. A healthy eating plan with less fat will help rest your pancreas and gallbladder and reduce your symptoms. WHAT DO I NEED TO KNOW ABOUT THIS DIET? Eat a low-fat diet. Reduce your fat intake to less than 20-30% of your total daily calories. This is less than 50-60 g of fat per day. Remember that you need some fat in your diet. Ask your dietician what your daily goal should  be. Choose nonfat and low-fat healthy foods. Look for the words "nonfat," "low fat," or "fat free." As a guide, look on the label and choose foods with less than 3 g of fat per serving. Eat only one serving. Avoid alcohol. Do not smoke. If you need help quitting, talk with your health care provider. Eat small frequent meals instead of three large heavy meals. WHAT FOODS CAN I EAT? Grains Include healthy grains and starches such as potatoes, wheat bread, fiber-rich cereal, and brown rice. Choose whole grain options whenever possible. In adults, whole grains should account for 45-65% of your daily calories.  Fruits and Vegetables Eat plenty of fruits and vegetables. Fresh fruits and vegetables add fiber to your diet. Meats and Other Protein Sources Eat lean meat such as chicken and pork. Trim any fat off of meat before cooking it. Eggs, fish, and beans are other sources of protein. In adults, these foods should account for 10-35% of your daily calories. Dairy Choose low-fat milk and dairy options. Dairy includes fat and protein, as well as calcium.  Fats and Oils Limit high-fat foods such as fried foods, sweets, baked goods, sugary drinks.  Other Creamy sauces and condiments, such as mayonnaise, can add extra fat. Think about whether or not you need to use them, or use smaller amounts or low fat options.   WHAT FOODS ARE NOT RECOMMENDED? High fat foods, such as: Baked goods. Ice cream. French toast. Sweet rolls. Pizza. Cheese bread. Foods covered with batter, butter, creamy sauces, or cheese. Fried foods. Sugary drinks and desserts. Foods that cause gas or bloating   This information is not intended to replace advice given to you by your health care provider. Make sure you discuss any questions you have with your health care provider.   Document Released: 03/28/2013 Document Reviewed: 03/28/2013 Elsevier Interactive Patient Education 2016 Elsevier Inc.   

## 2023-06-14 NOTE — Progress Notes (Signed)
 Outpatient Surgical Follow Up  06/14/2023  Adrian Aguilar is an 89 y.o. male.   Chief Complaint  Patient presents with   Follow-up    HPI: Adrian Aguilar is an 89 year old male with history of hyponatremia,  AAA s/p EVARS, HTN, CKD ( creat 1.5). He recently came 2 months ago with AMS and retention. Subsequently had N/V and abdominal pain c/w cholecystitis. He was managed with Chole tube. He did have CT as well as a cholagiogram via existing tube, now showed chronic obstruction. Most recent CT pers reviewed showing improveemnt in cholecystitis as well He has improved significantly over the last few weeks, he is more engaged, no abd pain, no fevers, jaundice or vomiting. Tolerating diet  Note that I have personally reviewed the images He lives with his wife.  He is Able to walk without significant assistance but at a slow pace. He still has preserve cognitive function although not to the same level as compared to 10 years ago.  He does not drive anymore.   He is originally from [place].  He moved to this country when he was around 89 years old.  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 He has never been exposed to tobacco smoke. He has never used smokeless tobacco. He reports that he does not currently use alcohol. He reports that he does not use  drugs.  Allergies:  Allergies  Allergen Reactions   Meat Extract Itching, Other (See Comments) and Rash    Red meat   Rosuvastatin Other (See Comments)    SEVERE LEG PAIN   Allopurinol Other (See Comments)    Lethargy   Propofol Other (See Comments)    Other   Shellfish Allergy Other (See Comments)    According to wife. N/V only    Medications reviewed.    ROS Full ROS performed and is otherwise negative other than what is stated in HPI   BP (!) 160/76   Pulse 80   Temp 98.2 F (36.8 C)   Ht 5\' 11"  (1.803 m)   Wt 151 lb (68.5 kg)   SpO2 100%   BMI 21.06 kg/m   Physical Exam Vitals and nursing note reviewed. Exam conducted with a chaperone present.  Constitutional:      General: He is not in acute distress.    Appearance: Normal appearance. He is not ill-appearing.  Cardiovascular:     Rate and Rhythm: Normal rate and regular rhythm.     Heart sounds: No murmur heard. Pulmonary:     Effort: Pulmonary effort is normal. No respiratory distress.     Breath sounds: Normal breath sounds. No stridor. No wheezing or rhonchi.  Abdominal:     General: Abdomen is flat.  There is no distension.     Palpations: Abdomen is soft. There is no mass.     Tenderness: There is no abdominal tenderness. There is no guarding.     Comments: Cholecystostomy tube in place with minimal output, No infection, No peritonitis , No murphy sign  Musculoskeletal:        General: Normal range of motion.     Cervical back: Normal range of motion and neck supple. No rigidity or tenderness.  Skin:    General: Skin is warm and dry.     Capillary Refill: Capillary refill takes less than 2 seconds.  Neurological:     General: No focal deficit present.     Mental Status: He is alert and oriented to person, place, and time.  Psychiatric:        Mood and Affect: Mood normal.        Behavior: Behavior normal.        Thought Content: Thought content normal.        Judgment: Judgment normal.     Assessment/Plan: 89 yo male with evidence of chronic cholecystitis s/p chole drain 10 weeks ago. I had a good discussion with pt and family, I did tell them that definitive management will have to be accomplish with cholecystectomy. I do think he is a good candidate for robotic approach. He still has decent physiologic reserve. They wish to do this ASAP. I would continue perioperative ASA since he has multiple CV risks factors and is s/p evars. The risks, benefits, complications, treatment options, and expected outcomes were discussed with the patient. The possibilities of bleeding, recurrent infection, finding a normal gallbladder, perforation of viscus organs, damage to surrounding structures, bile leak, abscess formation, needing a drain placed, the need for additional procedures, reaction to medication, pulmonary aspiration,  failure to diagnose a condition, the possible need to convert to an open procedure, and creating a complication requiring transfusion or operation were discussed with the patient. The patient and/or family concurred with the proposed plan, giving informed consent.  I Spent greater than 50 minutes in this encounter including extensive review of medical records, personally reviewing images, coordinating his care, placing orders and performing documentation.  Sterling Big, MD [facility] General Surgeon

## 2023-06-14 NOTE — H&P (View-Only) (Signed)
 Outpatient Surgical Follow Up  06/14/2023  Adrian Aguilar is an 89 y.o. male.   Chief Complaint  Patient presents with   Follow-up    HPI: Adrian Aguilar is an 89 year old male with history of hyponatremia,  AAA s/p EVARS, HTN, CKD ( creat 1.5). He recently came 2 months ago with AMS and retention. Subsequently had N/V and abdominal pain c/w cholecystitis. He was managed with Chole tube. He did have CT as well as a cholagiogram via existing tube, now showed chronic obstruction. Most recent CT pers reviewed showing improveemnt in cholecystitis as well He has improved significantly over the last few weeks, he is more engaged, no abd pain, no fevers, jaundice or vomiting. Tolerating diet  Note that I have personally reviewed the images He lives with his wife.  He is Able to walk without significant assistance but at a slow pace. He still has preserve cognitive function although not to the same level as compared to 10 years ago.  He does not drive anymore.   He is originally from [place].  He moved to this country when he was around 89 years old.  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 He has never been exposed to tobacco smoke. He has never used smokeless tobacco. He reports that he does not currently use alcohol. He reports that he does not use  drugs.  Allergies:  Allergies  Allergen Reactions   Meat Extract Itching, Other (See Comments) and Rash    Red meat   Rosuvastatin Other (See Comments)    SEVERE LEG PAIN   Allopurinol Other (See Comments)    Lethargy   Propofol Other (See Comments)    Other   Shellfish Allergy Other (See Comments)    According to wife. N/V only    Medications reviewed.    ROS Full ROS performed and is otherwise negative other than what is stated in HPI   BP (!) 160/76   Pulse 80   Temp 98.2 F (36.8 C)   Ht 5\' 11"  (1.803 m)   Wt 151 lb (68.5 kg)   SpO2 100%   BMI 21.06 kg/m   Physical Exam Vitals and nursing note reviewed. Exam conducted with a chaperone present.  Constitutional:      General: He is not in acute distress.    Appearance: Normal appearance. He is not ill-appearing.  Cardiovascular:     Rate and Rhythm: Normal rate and regular rhythm.     Heart sounds: No murmur heard. Pulmonary:     Effort: Pulmonary effort is normal. No respiratory distress.     Breath sounds: Normal breath sounds. No stridor. No wheezing or rhonchi.  Abdominal:     General: Abdomen is flat.  There is no distension.     Palpations: Abdomen is soft. There is no mass.     Tenderness: There is no abdominal tenderness. There is no guarding.     Comments: Cholecystostomy tube in place with minimal output, No infection, No peritonitis , No murphy sign  Musculoskeletal:        General: Normal range of motion.     Cervical back: Normal range of motion and neck supple. No rigidity or tenderness.  Skin:    General: Skin is warm and dry.     Capillary Refill: Capillary refill takes less than 2 seconds.  Neurological:     General: No focal deficit present.     Mental Status: He is alert and oriented to person, place, and time.  Psychiatric:        Mood and Affect: Mood normal.        Behavior: Behavior normal.        Thought Content: Thought content normal.        Judgment: Judgment normal.     Assessment/Plan: 89 yo male with evidence of chronic cholecystitis s/p chole drain 10 weeks ago. I had a good discussion with pt and family, I did tell them that definitive management will have to be accomplish with cholecystectomy. I do think he is a good candidate for robotic approach. He still has decent physiologic reserve. They wish to do this ASAP. I would continue perioperative ASA since he has multiple CV risks factors and is s/p evars. The risks, benefits, complications, treatment options, and expected outcomes were discussed with the patient. The possibilities of bleeding, recurrent infection, finding a normal gallbladder, perforation of viscus organs, damage to surrounding structures, bile leak, abscess formation, needing a drain placed, the need for additional procedures, reaction to medication, pulmonary aspiration,  failure to diagnose a condition, the possible need to convert to an open procedure, and creating a complication requiring transfusion or operation were discussed with the patient. The patient and/or family concurred with the proposed plan, giving informed consent.  I Spent greater than 50 minutes in this encounter including extensive review of medical records, personally reviewing images, coordinating his care, placing orders and performing documentation.  Sterling Big, MD [facility] General Surgeon

## 2023-06-15 ENCOUNTER — Other Ambulatory Visit: Payer: Self-pay

## 2023-06-15 ENCOUNTER — Encounter
Admission: RE | Admit: 2023-06-15 | Discharge: 2023-06-15 | Disposition: A | Discharge status: Home / Self Care | Source: Ambulatory Visit | Attending: Surgery | Admitting: Surgery

## 2023-06-15 ENCOUNTER — Encounter: Payer: Self-pay | Admitting: Surgery

## 2023-06-15 DIAGNOSIS — E871 Hypo-osmolality and hyponatremia: Secondary | ICD-10-CM

## 2023-06-15 DIAGNOSIS — N1832 Chronic kidney disease, stage 3b: Secondary | ICD-10-CM

## 2023-06-15 HISTORY — DX: Other specified postprocedural states: Z98.890

## 2023-06-15 HISTORY — DX: Retention of urine, unspecified: R33.9

## 2023-06-15 HISTORY — DX: Other specified postprocedural states: R11.2

## 2023-06-15 HISTORY — DX: Gout, unspecified: M10.9

## 2023-06-15 HISTORY — DX: Unspecified dementia, unspecified severity, without behavioral disturbance, psychotic disturbance, mood disturbance, and anxiety: F03.90

## 2023-06-15 NOTE — Patient Instructions (Addendum)
 Your procedure is scheduled on: 06/17/23 - Thursday Report to the Registration Desk on the 1st floor of the [facility]. To find out your arrival time, please call [phone] between 1PM - 3PM on:)06/16/23 - Wednesday If your arrival time is 6:00 am, do not arrive before that time as the [facility] entrance doors do not open until 6:00 am.  REMEMBER: Instructions that are not followed completely may result in serious medical risk, up to and including death; or upon the discretion of your surgeon and anesthesiologist your surgery may need to be rescheduled.  Do not eat food after midnight the night before surgery.  No gum chewing or hard candies.  You may however, drink CLEAR liquids up to 2 hours before you are scheduled to arrive for your surgery. Do not drink anything within 2 hours of your scheduled arrival time.  Clear liquids include: - water  - apple juice without pulp - gatorade (not RED colors) - black coffee or tea (Do NOT add milk or creamers to the coffee or tea) Do NOT drink anything that is not on this list.  One week prior to surgery: Stop Anti-inflammatories (NSAIDS) such as Advil, Aleve, Ibuprofen, Motrin, Naproxen, Naprosyn and Aspirin based products such as Excedrin, Goody's Powder, BC Powder. You may take Tylenol if needed for pain up until the day of surgery.  Stop ANY OVER THE COUNTER supplements until after surgery.   ON THE DAY OF SURGERY ONLY TAKE THESE MEDICATIONS WITH SIPS OF WATER:  colchicine  febuxostat (ULORIC)   No Alcohol for 24 hours before or after surgery.  No Smoking including e-cigarettes for 24 hours before surgery.  No chewable tobacco products for at least 6 hours before surgery.  No nicotine patches on the day of surgery.  Do not use any "recreational" drugs for at least a week (preferably 2 weeks) before your surgery.  Please be advised that the combination of cocaine and anesthesia may have negative outcomes, up to and including  death. If you test positive for cocaine, your surgery will be cancelled.  On the morning of surgery brush your teeth with toothpaste and water, you may rinse your mouth with mouthwash if you wish. Do not swallow any toothpaste or mouthwash.  Use CHG Soap or wipes as directed on instruction sheet.  Do not wear jewelry, make-up, hairpins, clips or nail polish.  For welded (permanent) jewelry: bracelets, anklets, waist bands, etc.  Please have this removed prior to surgery.  If it is not removed, there is a chance that hospital personnel will need to cut it off on the day of surgery.  Do not wear lotions, powders, or perfumes.   Do not shave body hair from the neck down 48 hours before surgery.  Contact lenses, hearing aids and dentures may not be worn into surgery.  Do not bring valuables to the hospital. [facility] is not responsible for any missing/lost belongings or valuables.   Notify your doctor if there is any change in your medical condition (cold, fever, infection).  Wear comfortable clothing (specific to your surgery type) to the hospital.  After surgery, you can help prevent lung complications by doing breathing exercises.  Take deep breaths and cough every 1-2 hours. Your doctor may order a device called an Incentive Spirometer to help you take deep breaths.  When coughing or sneezing, hold a pillow firmly against your incision with both hands. This is called "splinting." Doing this helps protect your incision. It also decreases belly  discomfort.  If you are being admitted to the hospital overnight, leave your suitcase in the car. After surgery it may be brought to your room.  In case of increased patient census, it may be necessary for you, the patient, to continue your postoperative care in the Same Day Surgery department.  If you are being discharged the day of surgery, you will not be allowed to drive home. You will need a responsible individual to drive you home and  stay with you for 24 hours after surgery.   If you are taking public transportation, you will need to have a responsible individual with you.  Please call the Pre-admissions Testing Dept. at [phone] if you have any questions about these instructions.  Surgery Visitation Policy:  Patients having surgery or a procedure may have two visitors.  Children under the age of 41 must have an adult with them who is not the patient.  Temporary Visitor Restrictions Due to increasing cases of flu, RSV and COVID-19: Children ages 81 and under will not be able to visit patients in [facility] hospitals under most circumstances.  Inpatient Visitation:    Visiting hours are 7 a.m. to 8 p.m. Up to four visitors are allowed at one time in a patient room. The visitors may rotate out with other people during the day.  One visitor age 54 or older may stay with the patient overnight and must be in the room by 8 p.m.    Preparing the Skin Before Surgery     To help prevent the risk of infection at your surgical site, we are now providing you with rinse-free Sage 2% Chlorhexidine Gluconate (CHG) disposable wipes.  Chlorhexidine Gluconate (CHG) Soap  o An antiseptic cleaner that kills germs and bonds with the skin to continue killing germs even after washing  o Used for showering the night before surgery and morning of surgery  The night before surgery: Shower or bathe with warm water. Do not apply perfume, lotions, powders. Wait one hour after shower. Skin should be dry and cool. Open Sage wipe package - use 6 disposable cloths. Wipe body using one cloth for the right arm, one cloth for the left arm, one cloth for the right leg, one cloth for the left leg, one cloth for the chest/abdomen area, and one cloth for the back. Do not use on open wounds or sores. Do not use on face or genitals (private parts). If you are breast feeding, do not use on breasts. 5. Do not rinse, allow to dry. 6.  Skin may feel "tacky" for several minutes. 7. Dress in clean clothes. 8. Place clean sheets on your bed and do not sleep with pets.  REPEAT ABOVE ON THE MORNING OF SURGERY BEFORE ARRIVING TO THE HOSPITAL.

## 2023-06-16 ENCOUNTER — Encounter
Admission: RE | Admit: 2023-06-16 | Discharge: 2023-06-16 | Disposition: A | Discharge status: Home / Self Care | Source: Ambulatory Visit | Attending: Surgery | Admitting: Surgery

## 2023-06-16 ENCOUNTER — Encounter: Payer: Self-pay | Admitting: Urgent Care

## 2023-06-16 DIAGNOSIS — N1832 Chronic kidney disease, stage 3b: Secondary | ICD-10-CM

## 2023-06-16 DIAGNOSIS — Z01812 Encounter for preprocedural laboratory examination: Secondary | ICD-10-CM | POA: Insufficient documentation

## 2023-06-16 DIAGNOSIS — E871 Hypo-osmolality and hyponatremia: Secondary | ICD-10-CM

## 2023-06-16 DIAGNOSIS — K8011 Calculus of gallbladder with chronic cholecystitis with obstruction: Secondary | ICD-10-CM | POA: Diagnosis not present

## 2023-06-16 LAB — CBC WITH DIFFERENTIAL/PLATELET
Abs Immature Granulocytes: 0.06 10*3/uL (ref 0.00–0.07)
Basophils Absolute: 0.1 10*3/uL (ref 0.0–0.1)
Basophils Relative: 1 %
Eosinophils Absolute: 0.2 10*3/uL (ref 0.0–0.5)
Eosinophils Relative: 2 %
HCT: 34.9 % — ABNORMAL LOW (ref 39.0–52.0)
Hemoglobin: 11.8 g/dL — ABNORMAL LOW (ref 13.0–17.0)
Immature Granulocytes: 1 %
Lymphocytes Relative: 18 %
Lymphs Abs: 1.7 10*3/uL (ref 0.7–4.0)
MCH: 29.4 pg (ref 26.0–34.0)
MCHC: 33.8 g/dL (ref 30.0–36.0)
MCV: 87 fL (ref 80.0–100.0)
Monocytes Absolute: 1.1 10*3/uL — ABNORMAL HIGH (ref 0.1–1.0)
Monocytes Relative: 12 %
Neutro Abs: 6.4 10*3/uL (ref 1.7–7.7)
Neutrophils Relative %: 66 %
Platelets: 510 10*3/uL — ABNORMAL HIGH (ref 150–400)
RBC: 4.01 MIL/uL — ABNORMAL LOW (ref 4.22–5.81)
RDW: 13.7 % (ref 11.5–15.5)
WBC: 9.5 10*3/uL (ref 4.0–10.5)
nRBC: 0 % (ref 0.0–0.2)

## 2023-06-16 LAB — BASIC METABOLIC PANEL
Anion gap: 9 (ref 5–15)
BUN: 20 mg/dL (ref 8–23)
CO2: 26 mmol/L (ref 22–32)
Calcium: 9.4 mg/dL (ref 8.9–10.3)
Chloride: 101 mmol/L (ref 98–111)
Creatinine, Ser: 1.24 mg/dL (ref 0.61–1.24)
GFR, Estimated: 56 mL/min — ABNORMAL LOW (ref >60)
Glucose, Bld: 80 mg/dL (ref 70–99)
Potassium: 3.4 mmol/L — ABNORMAL LOW (ref 3.5–5.1)
Sodium: 136 mmol/L (ref 135–145)

## 2023-06-16 MED ORDER — CHLORHEXIDINE GLUCONATE CLOTH 2 % EX PADS: 6.0000 | MEDICATED_PAD | Freq: Once | CUTANEOUS | Status: DC

## 2023-06-16 MED ORDER — GABAPENTIN 300 MG PO CAPS
300.0000 mg | ORAL_CAPSULE | ORAL | Status: AC
  Administered 2023-06-17: 300 mg via ORAL

## 2023-06-16 MED ORDER — ORAL CARE MOUTH RINSE: 15.0000 mL | Freq: Once | OROMUCOSAL | Status: AC

## 2023-06-16 MED ORDER — CHLORHEXIDINE GLUCONATE CLOTH 2 % EX PADS
6.0000 | MEDICATED_PAD | Freq: Once | CUTANEOUS | Status: AC
  Administered 2023-06-17: 6 via TOPICAL

## 2023-06-16 MED ORDER — CEFAZOLIN SODIUM-DEXTROSE 2-4 GM/100ML-% IV SOLN
2.0000 g | INTRAVENOUS | Status: AC
  Administered 2023-06-17: 2 g via INTRAVENOUS

## 2023-06-16 MED ORDER — CHLORHEXIDINE GLUCONATE 0.12 % MT SOLN
15.0000 mL | Freq: Once | OROMUCOSAL | Status: AC
  Administered 2023-06-17: 15 mL via OROMUCOSAL

## 2023-06-16 MED ORDER — LACTATED RINGERS IV SOLN: INTRAVENOUS | Status: DC

## 2023-06-16 MED ORDER — INDOCYANINE GREEN 25 MG IV SOLR
1.2500 mg | Freq: Once | INTRAVENOUS | Status: AC
  Administered 2023-06-17: 1.25 mg via INTRAVENOUS

## 2023-06-16 MED ORDER — ACETAMINOPHEN 500 MG PO TABS
1000.0000 mg | ORAL_TABLET | ORAL | Status: AC
  Administered 2023-06-17: 1000 mg via ORAL

## 2023-06-17 ENCOUNTER — Other Ambulatory Visit: Payer: Self-pay

## 2023-06-17 ENCOUNTER — Ambulatory Visit: Admitting: Urgent Care

## 2023-06-17 ENCOUNTER — Encounter
Admission: RE | Disposition: A | Discharge status: Home / Self Care | Payer: Self-pay | Source: Home / Self Care | Attending: Surgery

## 2023-06-17 ENCOUNTER — Ambulatory Visit

## 2023-06-17 ENCOUNTER — Ambulatory Visit
Admission: RE | Admit: 2023-06-17 | Discharge: 2023-06-17 | Disposition: A | Discharge status: Home / Self Care | Attending: Surgery | Admitting: Surgery

## 2023-06-17 ENCOUNTER — Encounter: Payer: Self-pay | Admitting: Surgery

## 2023-06-17 DIAGNOSIS — K8011 Calculus of gallbladder with chronic cholecystitis with obstruction: Secondary | ICD-10-CM | POA: Diagnosis not present

## 2023-06-17 DIAGNOSIS — K810 Acute cholecystitis: Secondary | ICD-10-CM | POA: Insufficient documentation

## 2023-06-17 DIAGNOSIS — N183 Chronic kidney disease, stage 3 unspecified: Secondary | ICD-10-CM | POA: Diagnosis not present

## 2023-06-17 DIAGNOSIS — K828 Other specified diseases of gallbladder: Secondary | ICD-10-CM | POA: Diagnosis not present

## 2023-06-17 DIAGNOSIS — I739 Peripheral vascular disease, unspecified: Secondary | ICD-10-CM | POA: Diagnosis not present

## 2023-06-17 DIAGNOSIS — Z833 Family history of diabetes mellitus: Secondary | ICD-10-CM | POA: Diagnosis not present

## 2023-06-17 DIAGNOSIS — D631 Anemia in chronic kidney disease: Secondary | ICD-10-CM | POA: Insufficient documentation

## 2023-06-17 DIAGNOSIS — I129 Hypertensive chronic kidney disease with stage 1 through stage 4 chronic kidney disease, or unspecified chronic kidney disease: Secondary | ICD-10-CM | POA: Insufficient documentation

## 2023-06-17 HISTORY — PX: INTRAOPERATIVE CHOLANGIOGRAM: SHX5230

## 2023-06-17 HISTORY — DX: Atherosclerosis of aorta: I70.0

## 2023-06-17 HISTORY — DX: Benign prostatic hyperplasia without lower urinary tract symptoms: N40.0

## 2023-06-17 HISTORY — DX: Sepsis, unspecified organism: A41.9

## 2023-06-17 HISTORY — DX: Cataract extraction status, right eye: Z98.41

## 2023-06-17 HISTORY — DX: Chronic kidney disease, stage 3 unspecified: N18.30

## 2023-06-17 SURGERY — CHOLECYSTECTOMY, ROBOT-ASSISTED, LAPAROSCOPIC
Anesthesia: General

## 2023-06-17 MED ORDER — SODIUM CHLORIDE 0.9 % IR SOLN
Status: DC | PRN
  Administered 2023-06-17: 400 mL

## 2023-06-17 MED ORDER — CEFAZOLIN SODIUM-DEXTROSE 2-4 GM/100ML-% IV SOLN
INTRAVENOUS | Status: AC
  Filled 2023-06-17: qty 100

## 2023-06-17 MED ORDER — ROCURONIUM BROMIDE 100 MG/10ML IV SOLN
INTRAVENOUS | Status: DC | PRN
  Administered 2023-06-17: 10 mg via INTRAVENOUS
  Administered 2023-06-17: 50 mg via INTRAVENOUS

## 2023-06-17 MED ORDER — EPHEDRINE 5 MG/ML INJ
INTRAVENOUS | Status: AC
  Filled 2023-06-17: qty 5

## 2023-06-17 MED ORDER — LABETALOL HCL 5 MG/ML IV SOLN
INTRAVENOUS | Status: AC
  Filled 2023-06-17: qty 4

## 2023-06-17 MED ORDER — DEXAMETHASONE SODIUM PHOSPHATE 10 MG/ML IJ SOLN
INTRAMUSCULAR | Status: DC | PRN
  Administered 2023-06-17: 5 mg via INTRAVENOUS

## 2023-06-17 MED ORDER — BUPIVACAINE-EPINEPHRINE (PF) 0.25% -1:200000 IJ SOLN
INTRAMUSCULAR | Status: DC | PRN
  Administered 2023-06-17: 30 mL

## 2023-06-17 MED ORDER — BUPIVACAINE LIPOSOME 1.3 % IJ SUSP
INTRAMUSCULAR | Status: DC | PRN
  Administered 2023-06-17: 20 mL

## 2023-06-17 MED ORDER — EPHEDRINE SULFATE-NACL 50-0.9 MG/10ML-% IV SOSY
PREFILLED_SYRINGE | INTRAVENOUS | Status: DC | PRN
  Administered 2023-06-17: 10 mg via INTRAVENOUS

## 2023-06-17 MED ORDER — IOHEXOL 180 MG/ML  SOLN
INTRAMUSCULAR | Status: DC | PRN
  Administered 2023-06-17: 10 mL

## 2023-06-17 MED ORDER — LABETALOL HCL 5 MG/ML IV SOLN
INTRAVENOUS | Status: DC | PRN
  Administered 2023-06-17: 5 mg via INTRAVENOUS

## 2023-06-17 MED ORDER — PROPOFOL 10 MG/ML IV BOLUS
INTRAVENOUS | Status: DC | PRN
  Administered 2023-06-17: 100 mg via INTRAVENOUS
  Administered 2023-06-17: 30 mg via INTRAVENOUS

## 2023-06-17 MED ORDER — LIDOCAINE HCL (CARDIAC) PF 100 MG/5ML IV SOSY
PREFILLED_SYRINGE | INTRAVENOUS | Status: DC | PRN
  Administered 2023-06-17: 60 mg via INTRAVENOUS

## 2023-06-17 MED ORDER — OXYCODONE HCL 5 MG/5ML PO SOLN
ORAL | Status: AC
  Filled 2023-06-17: qty 5

## 2023-06-17 MED ORDER — ROCURONIUM BROMIDE 100 MG/10ML IV SOLN: INTRAVENOUS | Status: DC | PRN

## 2023-06-17 MED ORDER — ONDANSETRON HCL 4 MG/2ML IJ SOLN
INTRAMUSCULAR | Status: AC
  Filled 2023-06-17: qty 2

## 2023-06-17 MED ORDER — BUPIVACAINE-EPINEPHRINE (PF) 0.25% -1:200000 IJ SOLN
INTRAMUSCULAR | Status: AC
  Filled 2023-06-17: qty 30

## 2023-06-17 MED ORDER — OXYCODONE HCL 5 MG PO TABS: 5.0000 mg | ORAL_TABLET | Freq: Once | ORAL | Status: AC | PRN

## 2023-06-17 MED ORDER — ONDANSETRON HCL 4 MG PO TABS: 4.0000 mg | ORAL_TABLET | Freq: Three times a day (TID) | ORAL | 0 refills | Status: DC | PRN

## 2023-06-17 MED ORDER — DROPERIDOL 2.5 MG/ML IJ SOLN
INTRAMUSCULAR | Status: AC
  Filled 2023-06-17: qty 2

## 2023-06-17 MED ORDER — FENTANYL CITRATE (PF) 100 MCG/2ML IJ SOLN
25.0000 ug | INTRAMUSCULAR | Status: DC | PRN
  Administered 2023-06-17 (×3): 25 ug via INTRAVENOUS

## 2023-06-17 MED ORDER — FENTANYL CITRATE (PF) 100 MCG/2ML IJ SOLN
INTRAMUSCULAR | Status: DC | PRN
  Administered 2023-06-17 (×4): 25 ug via INTRAVENOUS

## 2023-06-17 MED ORDER — SUGAMMADEX SODIUM 200 MG/2ML IV SOLN
INTRAVENOUS | Status: DC | PRN
  Administered 2023-06-17: 200 mg via INTRAVENOUS

## 2023-06-17 MED ORDER — ONDANSETRON HCL 4 MG/2ML IJ SOLN
INTRAMUSCULAR | Status: DC | PRN
  Administered 2023-06-17: 4 mg via INTRAVENOUS

## 2023-06-17 MED ORDER — FENTANYL CITRATE (PF) 100 MCG/2ML IJ SOLN
INTRAMUSCULAR | Status: AC
  Filled 2023-06-17: qty 2

## 2023-06-17 MED ORDER — ACETAMINOPHEN 500 MG PO TABS
ORAL_TABLET | ORAL | Status: AC
  Filled 2023-06-17: qty 2

## 2023-06-17 MED ORDER — PHENYLEPHRINE 80 MCG/ML (10ML) SYRINGE FOR IV PUSH (FOR BLOOD PRESSURE SUPPORT)
PREFILLED_SYRINGE | INTRAVENOUS | Status: AC
  Filled 2023-06-17: qty 10

## 2023-06-17 MED ORDER — PROPOFOL 10 MG/ML IV BOLUS
INTRAVENOUS | Status: AC
  Filled 2023-06-17: qty 20

## 2023-06-17 MED ORDER — DEXAMETHASONE SODIUM PHOSPHATE 10 MG/ML IJ SOLN
INTRAMUSCULAR | Status: AC
  Filled 2023-06-17: qty 1

## 2023-06-17 MED ORDER — DROPERIDOL 2.5 MG/ML IJ SOLN
0.6250 mg | Freq: Once | INTRAMUSCULAR | Status: AC | PRN
  Administered 2023-06-17: 0.625 mg via INTRAVENOUS

## 2023-06-17 MED ORDER — BUPIVACAINE LIPOSOME 1.3 % IJ SUSP
INTRAMUSCULAR | Status: AC
  Filled 2023-06-17: qty 20

## 2023-06-17 MED ORDER — SUGAMMADEX SODIUM 200 MG/2ML IV SOLN
INTRAVENOUS | Status: AC
  Filled 2023-06-17: qty 2

## 2023-06-17 MED ORDER — OXYCODONE HCL 5 MG/5ML PO SOLN
5.0000 mg | Freq: Once | ORAL | Status: AC | PRN
  Administered 2023-06-17: 5 mg via ORAL

## 2023-06-17 MED ORDER — PHENYLEPHRINE 80 MCG/ML (10ML) SYRINGE FOR IV PUSH (FOR BLOOD PRESSURE SUPPORT)
PREFILLED_SYRINGE | INTRAVENOUS | Status: DC | PRN
  Administered 2023-06-17: 80 ug via INTRAVENOUS

## 2023-06-17 MED ORDER — INDOCYANINE GREEN 25 MG IV SOLR
INTRAVENOUS | Status: AC
  Filled 2023-06-17: qty 10

## 2023-06-17 MED ORDER — HYDROCODONE-ACETAMINOPHEN 5-325 MG PO TABS
1.0000 | ORAL_TABLET | Freq: Four times a day (QID) | ORAL | 0 refills | Status: DC | PRN
Start: 2023-06-17 — End: 2023-06-30

## 2023-06-17 MED ORDER — GABAPENTIN 300 MG PO CAPS
ORAL_CAPSULE | ORAL | Status: AC
  Filled 2023-06-17: qty 1

## 2023-06-17 MED ORDER — CHLORHEXIDINE GLUCONATE 0.12 % MT SOLN
OROMUCOSAL | Status: AC
  Filled 2023-06-17: qty 15

## 2023-06-17 MED ORDER — ACETAMINOPHEN 10 MG/ML IV SOLN: 1000.0000 mg | Freq: Once | INTRAVENOUS | Status: DC | PRN

## 2023-06-17 SURGICAL SUPPLY — 42 items
CANNULA REDUCER 12-8 DVNC XI (CANNULA) ×2 IMPLANT
CATH REDDICK CHOLANGI 4FR 50CM (CATHETERS) IMPLANT
CAUTERY HOOK MNPLR 1.6 DVNC XI (INSTRUMENTS) ×2 IMPLANT
CLIP LIGATING HEMO O LOK GREEN (MISCELLANEOUS) ×2 IMPLANT
DERMABOND ADVANCED .7 DNX12 (GAUZE/BANDAGES/DRESSINGS) ×2 IMPLANT
DRAPE ARM DVNC X/XI (DISPOSABLE) ×8 IMPLANT
DRAPE COLUMN DVNC XI (DISPOSABLE) ×2 IMPLANT
ELECT REM PT RETURN 9FT ADLT (ELECTROSURGICAL) ×2 IMPLANT
ELECTRODE REM PT RTRN 9FT ADLT (ELECTROSURGICAL) ×2 IMPLANT
FORCEPS BPLR R/ABLATION 8 DVNC (INSTRUMENTS) ×2 IMPLANT
FORCEPS PROGRASP DVNC XI (FORCEP) ×2 IMPLANT
GLOVE BIO SURGEON STRL SZ7 (GLOVE) ×4 IMPLANT
GOWN STRL REUS W/ TWL LRG LVL3 (GOWN DISPOSABLE) ×8 IMPLANT
IRRIGATION STRYKERFLOW (MISCELLANEOUS) IMPLANT
IRRIGATOR STRYKERFLOW (MISCELLANEOUS) ×2 IMPLANT
IV CATH ANGIO 12GX3 LT BLUE (NEEDLE) IMPLANT
KIT PINK PAD W/HEAD ARE REST (MISCELLANEOUS) ×2 IMPLANT
KIT PINK PAD W/HEAD ARM REST (MISCELLANEOUS) ×2 IMPLANT
LABEL OR SOLS (LABEL) ×2 IMPLANT
MANIFOLD NEPTUNE II (INSTRUMENTS) ×2 IMPLANT
NDL HYPO 22X1.5 SAFETY MO (MISCELLANEOUS) ×2 IMPLANT
NEEDLE HYPO 22X1.5 SAFETY MO (MISCELLANEOUS) ×2 IMPLANT
NS IRRIG 500ML POUR BTL (IV SOLUTION) ×2 IMPLANT
OBTURATOR OPTICAL STND 8 DVNC (TROCAR) ×2 IMPLANT
OBTURATOR OPTICALSTD 8 DVNC (TROCAR) ×2 IMPLANT
PACK LAP CHOLECYSTECTOMY (MISCELLANEOUS) ×2 IMPLANT
SEAL UNIV 5-12 XI (MISCELLANEOUS) ×8 IMPLANT
SET TUBE SMOKE EVAC HIGH FLOW (TUBING) ×2 IMPLANT
SOL ELECTROSURG ANTI STICK (MISCELLANEOUS) ×2 IMPLANT
SOLUTION ELECTROSURG ANTI STCK (MISCELLANEOUS) ×2 IMPLANT
SPIKE FLUID TRANSFER (MISCELLANEOUS) ×2 IMPLANT
SPONGE T-LAP 18X18 ~~LOC~~+RFID (SPONGE) ×2 IMPLANT
SPONGE T-LAP 4X18 ~~LOC~~+RFID (SPONGE) IMPLANT
STOPCOCK 3WAY MALE LL (IV SETS) IMPLANT
SUT MNCRL AB 4-0 PS2 18 (SUTURE) ×2 IMPLANT
SUT VICRYL 0 UR6 27IN ABS (SUTURE) ×4 IMPLANT
SYR 20ML LL LF (SYRINGE) IMPLANT
SYS BAG RETRIEVAL 10MM (BASKET) ×2 IMPLANT
SYSTEM BAG RETRIEVAL 10MM (BASKET) ×2 IMPLANT
TRAP FLUID SMOKE EVACUATOR (MISCELLANEOUS) ×2 IMPLANT
WATER STERILE IRR 3000ML UROMA (IV SOLUTION) IMPLANT
WATER STERILE IRR 500ML POUR (IV SOLUTION) ×2 IMPLANT

## 2023-06-17 NOTE — Anesthesia Postprocedure Evaluation (Signed)
 Anesthesia Post Note  Patient: Geophysical data processor  Procedure(s) Performed: CHOLECYSTECTOMY, ROBOT-ASSISTED, LAPAROSCOPIC CHOLANGIOGRAM, INTRAOPERATIVE  Patient location during evaluation: PACU Anesthesia Type: General Level of consciousness: awake and alert Pain management: pain level controlled Vital Signs Assessment: post-procedure vital signs reviewed and stable Respiratory status: spontaneous breathing, nonlabored ventilation and respiratory function stable Cardiovascular status: blood pressure returned to baseline and stable Postop Assessment: no apparent nausea or vomiting Anesthetic complications: no   No notable events documented.   Last Vitals:  Vitals:   06/17/23 1245 06/17/23 1345  BP: (!) 178/65 (!) 175/60  Pulse: 66 68  Resp: 16 16  Temp: 36.4 C 36.6 C  SpO2: 99%     Last Pain:  Vitals:   06/17/23 1245  TempSrc: Temporal  PainSc:                  Foye Deer

## 2023-06-17 NOTE — Transfer of Care (Signed)
 Immediate Anesthesia Transfer of Care Note  Patient: Adrian Aguilar  Procedure(s) Performed: CHOLECYSTECTOMY, ROBOT-ASSISTED, LAPAROSCOPIC CHOLANGIOGRAM, INTRAOPERATIVE  Patient Location: PACU  Anesthesia Type:General  Level of Consciousness: drowsy  Airway & Oxygen Therapy: Patient Spontanous Breathing and Patient connected to face mask oxygen  Post-op Assessment: Report given to RN and Post -op Vital signs reviewed and stable  Post vital signs: Reviewed and stable  Last Vitals:  Vitals Value Taken Time  BP 176/54 06/17/23 1033  Temp    Pulse 66 06/17/23 1036  Resp 18 06/17/23 1036  SpO2 100 % 06/17/23 1036  Vitals shown include unfiled device data.  Last Pain:  Vitals:   06/17/23 0757  TempSrc: Tympanic  PainSc: 0-No pain         Complications: No notable events documented.

## 2023-06-17 NOTE — OR Nursing (Signed)
 Cholecystostomy tube removed by Dr. Everlene Farrier at [phone].

## 2023-06-17 NOTE — Op Note (Signed)
 Robotic assisted laparoscopic Cholecystectomy with intraoperative cholangiogram  Pre-operative Diagnosis: chronic cholecystitis  Post-operative Diagnosis: same  Procedure:  Robotic assisted laparoscopic Cholecystectomy with intraoperative cholangiogram  Surgeon: Sterling Big, MD FACS  Anesthesia: Gen. with endotracheal tube  Findings: Dense adhesion omentum plastered to the abdominal wall and to the GB, thi make the case difficult requiring additional and unusual extra time. Chronic severe Cholecystitis  Chronically obstructed cystic duct w/o filling of the CBD or duodenum  Estimated Blood Loss: 5cc       Specimens: Gallbladder           Complications: none   Procedure Details  The patient was seen again in the Holding Room. The benefits, complications, treatment options, and expected outcomes were discussed with the patient. The risks of bleeding, infection, recurrence of symptoms, failure to resolve symptoms, bile duct damage, bile duct leak, retained common bile duct stone, bowel injury, any of which could require further surgery and/or ERCP, stent, or papillotomy were reviewed with the patient. The likelihood of improving the patient's symptoms with return to their baseline status is good.  The patient and/or family concurred with the proposed plan, giving informed consent.  The patient was taken to Operating Room, identified  and the procedure verified as Laparoscopic Cholecystectomy.  A Time Out was held and the above information confirmed.  Prior to the induction of general anesthesia, antibiotic prophylaxis was administered. VTE prophylaxis was in place. General endotracheal anesthesia was then administered and tolerated well. After the induction, I did a cholangiogram via existing cholecystostomy tube, contrast was injected and fluoroscopic images obtained. THere was opacification of the gallbladder with a chronic obstruction of the cystic duct , no contrast seen in the CBD due  to the obstruction. Cholecystostomy tube removed w/o issues. The abdomen was prepped with Chloraprep and draped in the sterile fashion. The patient was positioned in the supine position.  Cut down technique was used to enter the abdominal cavity and a Hasson trochar was placed after two vicryl stitches were anchored to the fascia. Pneumoperitoneum was then created with CO2 and tolerated well without any adverse changes in the patient's vital signs.  Three 8-mm ports were placed under direct vision. All skin incisions  were infiltrated with a local anesthetic agent before making the incision and placing the trocars.   The patient was positioned  in reverse Trendelenburg, robot was brought to the surgical field and docked in the standard fashion.  We made sure all the instrumentation was kept indirect view at all times and that there were no collision between the arms. I scrubbed out and went to the console. The omentum was plastered to the abd wall and to the GB within the RUQ, dense adhesions were encountered and they were dissected with cutting electrocautery where  feasible. Duodenum was also push down . After significant dissection and lysis of adhesions I was able to restore the anatomy. THe GB was chronically inflamed. Please note that lysis of adhesions added significant additional time to the operation.  The gallbladder was identified, the fundus grasped and retracted cephalad. Adhesions were lysed bluntly. The infundibulum was grasped and retracted laterally, exposing the peritoneum overlying the triangle of Calot. This was then divided and exposed in a blunt fashion. An extended critical view of the cystic duct and cystic artery was obtained.  The cystic duct was clearly identified and bluntly dissected. Please note that there was hydrops and a chronic obstruction of the cystic duct. I tried milking a presumed stone in  the distal cystic duct was id did not budge. I did not force any tissues and  since it was non mobile I did not want to cause any potential injuries.   Artery and duct were double clipped and divided.  Using ICG cholangiography we visualize the cystic duct and CBD w/o evidence of bile injuries. The gallbladder was taken from the gallbladder fossa in a retrograde fashion with the electrocautery.  Hemostasis was achieved with the electrocautery. nspection of the right upper quadrant was performed. No bleeding, bile duct injury or leak, or bowel injury was noted. Robotic instruments and robotic arms were undocked in the standard fashion.  I scrubbed back in.  The gallbladder was removed and placed in an Endocatch bag.   Pneumoperitoneum was released.  The periumbilical port site was closed with interrumpted 0 Vicryl sutures. 4-0 subcuticular Monocryl was used to close the skin. Dermabond was  applied.  The patient was then extubated and brought to the recovery room in stable condition. Sponge, lap, and needle counts were correct at closure and at the conclusion of the case.               Sterling Big, MD, FACS

## 2023-06-17 NOTE — Interval H&P Note (Signed)
 History and Physical Interval Note:  06/17/2023 8:41 AM  Adrian Aguilar  has presented today for surgery, with the diagnosis of chronic cholecystitis.  The various methods of treatment have been discussed with the patient and family. After consideration of risks, benefits and other options for treatment, the patient has consented to  Procedure(s) with comments: CHOLECYSTECTOMY, ROBOT-ASSISTED, LAPAROSCOPIC (N/A) - with cholangiogram and ICG CHOLANGIOGRAM, INTRAOPERATIVE as a surgical intervention.  The patient's history has been reviewed, patient examined, no change in status, stable for surgery.  I have reviewed the patient's chart and labs.  Questions were answered to the patient's satisfaction.     [name] F Tharon Kitch

## 2023-06-17 NOTE — Anesthesia Preprocedure Evaluation (Signed)
 Anesthesia Evaluation  Patient identified by MRN, date of birth, ID band Patient awake    Reviewed: Allergy & Precautions, H&P , NPO status , Patient's Chart, lab work & pertinent test results  Airway Mallampati: II  TM Distance: >3 FB Neck ROM: full    Dental no notable dental hx.    Pulmonary neg pulmonary ROS   Pulmonary exam normal        Cardiovascular hypertension, + Peripheral Vascular Disease (s/p EVAR 03/28/2014, PAD, s/p RIGHT femoral-popliteal bypass 04/18/2014)  Normal cardiovascular exam     Neuro/Psych  PSYCHIATRIC DISORDERS     Dementia negative neurological ROS     GI/Hepatic Neg liver ROS,,,Chronic cholecystitis    Endo/Other  negative endocrine ROS    Renal/GU Renal InsufficiencyRenal disease     Musculoskeletal   Abdominal   Peds  Hematology  (+) Blood dyscrasia, anemia   Anesthesia Other Findings Past Medical History: No date: AAA (abdominal aortic aneurysm) (HCC)     Comment:  a.) s/p EVAR 03/28/2014 No date: Aortic atherosclerosis (HCC) No date: BPH (benign prostatic hyperplasia) 2025: Chronic cholecystitis due to gallbladder calculus with  obstruction No date: CKD (chronic kidney disease), stage III (HCC) No date: Dementia (HCC)     Comment:  a.) on apoaequorin No date: Gout No date: Hypertension No date: PAD (peripheral artery disease) (HCC)     Comment:  a.) s/p RIGHT femoral-popliteal bypass 04/18/2014 No date: PONV (postoperative nausea and vomiting) No date: Retention of urine No date: Sepsis (HCC) No date: Status post bilateral cataract extraction  Past Surgical History: 03/28/2014: AAA s/p EVAR; N/A No date: CATARACT EXTRACTION W/ INTRAOCULAR LENS IMPLANT; Bilateral No date: COARCTATION OF AORTA REPAIR 04/18/2014: FEMORAL-POPLITEAL BYPASS GRAFT; Right 05/02/2023: IR PERC CHOLECYSTOSTOMY 06/10/2023: IR RADIOLOGIST EVAL & MGMT  BMI    Body Mass Index: 21.06 kg/m       Reproductive/Obstetrics negative OB ROS                              Anesthesia Physical Anesthesia Plan  ASA: 3  Anesthesia Plan: General ETT   Post-op Pain Management:    Induction: Intravenous  PONV Risk Score and Plan: 2 and Ondansetron, Dexamethasone and Midazolam  Airway Management Planned: Oral ETT  Additional Equipment:   Intra-op Plan:   Post-operative Plan:   Informed Consent:      Dental Advisory Given  Plan Discussed with: CRNA and Surgeon  Anesthesia Plan Comments:          Anesthesia Quick Evaluation

## 2023-06-17 NOTE — Anesthesia Procedure Notes (Signed)
 Procedure Name: Intubation Date/Time: 06/17/2023 9:12 AM  Performed by: Monico Hoar, CRNAPre-anesthesia Checklist: Patient identified, Patient being monitored, Timeout performed, Emergency Drugs available and Suction available Patient Re-evaluated:Patient Re-evaluated prior to induction Oxygen Delivery Method: Circle system utilized Preoxygenation: Pre-oxygenation with 100% oxygen Induction Type: IV induction Ventilation: Mask ventilation without difficulty Laryngoscope Size: Mac and 4 Grade View: Grade I Tube type: Oral Tube size: 7.5 mm Number of attempts: 1 Airway Equipment and Method: Stylet Placement Confirmation: ETT inserted through vocal cords under direct vision, positive ETCO2 and breath sounds checked- equal and bilateral Secured at: 22 cm Tube secured with: Tape Dental Injury: Teeth and Oropharynx as per pre-operative assessment

## 2023-06-17 NOTE — Discharge Instructions (Signed)

## 2023-06-18 ENCOUNTER — Ambulatory Visit: Payer: Medicare Other | Admitting: Urology

## 2023-06-18 ENCOUNTER — Encounter: Payer: Self-pay | Admitting: Surgery

## 2023-06-18 VITALS — BP 203/80 | HR 88 | Ht 71.0 in | Wt 151.0 lb

## 2023-06-18 DIAGNOSIS — N401 Enlarged prostate with lower urinary tract symptoms: Secondary | ICD-10-CM | POA: Diagnosis not present

## 2023-06-18 DIAGNOSIS — Z792 Long term (current) use of antibiotics: Secondary | ICD-10-CM

## 2023-06-18 DIAGNOSIS — R339 Retention of urine, unspecified: Secondary | ICD-10-CM

## 2023-06-18 DIAGNOSIS — N138 Other obstructive and reflux uropathy: Secondary | ICD-10-CM | POA: Diagnosis not present

## 2023-06-18 LAB — SURGICAL PATHOLOGY

## 2023-06-18 MED ORDER — CEPHALEXIN 250 MG PO CAPS
500.0000 mg | ORAL_CAPSULE | Freq: Once | ORAL | Status: AC
  Administered 2023-06-18: 500 mg via ORAL

## 2023-06-18 MED ORDER — FINASTERIDE 5 MG PO TABS: 5.0000 mg | ORAL_TABLET | Freq: Every day | ORAL | 11 refills | Status: DC

## 2023-06-18 NOTE — Progress Notes (Signed)
 Cath Change/ Replacement  Patient is present today for a catheter change due to urinary retention.  8 ml of water was removed from the balloon, a 18 FR coude foley cath was removed without difficulty.  Patient was cleaned and prepped in a sterile fashion with betadine and 2% lidocaine jelly was instilled into the urethra. A 18 FR coude foley cath was replaced into the bladder, no complications were noted. Urine return was noted 30 ml and urine was light yellow in color. The balloon was filled with 10ml of sterile water. A leg bag was attached for drainage.  A night bag was also given to the patient and patient was given instruction on how to change from one bag to another. Patient was given proper instruction on catheter care.    Performed by: Rita Vialpando H RMA  Follow up: 1 month for voiding trial

## 2023-06-18 NOTE — Patient Instructions (Signed)
 OK to HOLD Flomax/Tamsulosin for a few weeks but resume taking it 1 week prior to your next appointment.

## 2023-06-18 NOTE — Progress Notes (Signed)
   06/18/23  CC:  Chief Complaint  Patient presents with   Cysto    HPI: 89 year old male fairly advanced dementia and multiple medical committees he presents today for cystoscopy in the setting of hematuria and urinary retention.  Calculated prostate volume 103 cc from CT scan.  He is accompanied today by his wife and another family member.  His Foley catheter was removed and then replaced at the time of the procedure.  Blood pressure (!) 203/80, pulse 88, height 5\' 11"  (1.803 m), weight 151 lb (68.5 kg). NED. A&Ox3.   No respiratory distress   Abd soft, NT, ND Normal phallus with bilateral descended testicles  Cystoscopy Procedure Note  Patient identification was confirmed, informed consent was obtained, and patient was prepped using Betadine solution.  Lidocaine jelly was administered per urethral meatus.     Pre-Procedure: - Inspection reveals a normal caliber ureteral meatus.  Procedure: The flexible cystoscope was introduced without difficulty - No urethral strictures/lesions are present. - Enlarged prostate  - Normal bladder neck - Bilateral ureteral orifices identified - Bladder mucosa  reveals no ulcers, tumors, or lesions with some mild cathter cystitis - No bladder stones - Moderate trabeculation with sacules  Retroflexion shows unremarkable bladder neck   Post-Procedure: - Patient tolerated the procedure well  Assessment/ Plan:  1. 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  They do not think that he would be able to self cath.  They have been doing fairly well with with the catheter.  We discussed the option of suprapubic tube and information was given today as well.  At this point in time, they would not try to optimize  medicine and reattempt a voiding trial next month which is reasonable especially after his most recent surgical procedure just yesterday.  We did talk about consideration of outlet procedures such as HoLEP TURP etc.  They are worried about urinary incontinence.  We also discussed PAE as an alternative but at this time they are not interested in moving forward with this either.  They are wondering what the utility of Flomax is always on a catheter.  They like to see him to take a break from this.  I think that is reasonable but would encourage him to start it back about a week before his next attempt a voiding trial.  They are agreeable this plan. - finasteride (PROSCAR) 5 MG tablet; Take 1 tablet (5 mg total) by mouth daily.  Dispense: 30 tablet; Refill: 11  2. Urinary retention - finasteride (PROSCAR) 5 MG tablet; Take 1 tablet (5 mg total) by mouth daily.  Dispense: 30 tablet; Refill: 11  3. Prophylactic antibiotic - cephALEXin (KEFLEX) capsule 500 mg  Return in 1 month for repeat voiding trial.  Vanna Scotland, MD

## 2023-06-30 ENCOUNTER — Encounter: Payer: Self-pay | Admitting: Surgery

## 2023-06-30 ENCOUNTER — Ambulatory Visit (INDEPENDENT_AMBULATORY_CARE_PROVIDER_SITE_OTHER): Admitting: Surgery

## 2023-06-30 VITALS — BP 153/73 | HR 73 | Ht 71.0 in | Wt 152.6 lb

## 2023-06-30 DIAGNOSIS — Z09 Encounter for follow-up examination after completed treatment for conditions other than malignant neoplasm: Secondary | ICD-10-CM

## 2023-06-30 DIAGNOSIS — K8011 Calculus of gallbladder with chronic cholecystitis with obstruction: Secondary | ICD-10-CM

## 2023-06-30 NOTE — Patient Instructions (Signed)

## 2023-07-01 NOTE — Progress Notes (Signed)
 Adrian Aguilar is 10 days out from robotic cholecystectomy.  This was uneventful.  He is doing well and back to baseline.  He is tolerating diet and he is ambulating.  Comes in with the wife  PE NAD  Abd: soft, nt, incisions healing well w/o infection  A/P Doing well w/o complications RTC ptn

## 2023-07-08 ENCOUNTER — Other Ambulatory Visit

## 2023-07-26 ENCOUNTER — Ambulatory Visit: Admitting: Urology

## 2023-07-29 ENCOUNTER — Ambulatory Visit: Admitting: Urology

## 2023-07-29 VITALS — BP 178/65 | HR 83

## 2023-07-29 DIAGNOSIS — N138 Other obstructive and reflux uropathy: Secondary | ICD-10-CM | POA: Diagnosis not present

## 2023-07-29 DIAGNOSIS — N401 Enlarged prostate with lower urinary tract symptoms: Secondary | ICD-10-CM | POA: Diagnosis not present

## 2023-07-29 DIAGNOSIS — R339 Retention of urine, unspecified: Secondary | ICD-10-CM

## 2023-07-29 NOTE — Progress Notes (Signed)
 Simple Catheter Placement  Due to urinary retention patient is present today for a foley cath placement.  Patient was cleaned and prepped in a sterile fashion with betadine and 2% lidocaine  jelly was instilled into the urethra. A 18 FR foley catheter was inserted, urine return was noted  580 ml, urine was yellow in color.  The balloon was filled with 10cc of sterile water.  A leg bag was attached for drainage. Patient was also given a night bag to take home and was given instruction on how to change from one bag to another.  Patient was given instruction on proper catheter care.  Patient tolerated well, no complications were noted   Performed by: Matilde Son, PA-C   Additional notes/ Follow up: Return in about 1 week (around 08/05/2023) for TOV .    His wife stated that he has been urinating at home and not complaining of discomfort.  I explained to the wife that unfortunately we do not know what his baseline residuals are and the last time we let him go overnight he ended up in the emergency room.  Our office will also not be open tomorrow, so I do not want to leave him vulnerable.  She is agreeable to put another catheter in today.  I offered to undergo another voiding trial in 1 month as his residual today seems to be somewhat less to give the finasteride  a little more time, the patient's wife insisted that we try next week as she feels that he is voiding better at home and is hoping to have more more improvement over the next few days.

## 2023-07-29 NOTE — Progress Notes (Signed)
 Catheter Removal  Patient is present today for a catheter removal.  9ml of water was drained from the balloon. A 18FR foley cath was removed from the bladder, no complications were noted. Patient tolerated well.  Performed by: Vernell Townley Magallon-Mariche,RMA

## 2023-08-04 ENCOUNTER — Ambulatory Visit: Admitting: Physician Assistant

## 2023-08-24 ENCOUNTER — Ambulatory Visit: Admitting: Physician Assistant

## 2023-08-24 VITALS — BP 124/59 | HR 73 | Ht 72.0 in | Wt 152.0 lb

## 2023-08-24 DIAGNOSIS — N138 Other obstructive and reflux uropathy: Secondary | ICD-10-CM

## 2023-08-24 DIAGNOSIS — N401 Enlarged prostate with lower urinary tract symptoms: Secondary | ICD-10-CM | POA: Diagnosis not present

## 2023-08-24 LAB — BLADDER SCAN AMB NON-IMAGING: Scan Result: 441

## 2023-08-24 NOTE — Progress Notes (Signed)
 Catheter Removal  Patient is present today for a catheter removal.  10ml of water was drained from the balloon. A 18 FR foley cath was removed from the bladder, no complications were noted. Patient tolerated well.  Performed by: Jola Nash  CMA  Follow up/ Additional notes: this afternoon at 4:00pm.

## 2023-08-24 NOTE — Progress Notes (Signed)
 08/24/2023 1:47 PM   Adrian Aguilar October 21, 1932 [id number]  CC: Chief Complaint  Patient presents with   Urinary Tract Infection   HPI: Adrian Aguilar is a 89 y.o. male with PMH BPH with urinary retention on Flomax  and finasteride  and dementia who presents today for repeat voiding trial.   Foley removed in the morning, see separate note.  He returned to clinic in the afternoon.  He was able to void multiple times but admits some difficulty doing so.  PVR 441 mL.  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 2025   CKD (chronic kidney disease), stage III (HCC)    Dementia (HCC)    a.) on apoaequorin   Gout    Hypertension    PAD (peripheral artery disease) (HCC)    a.) s/p RIGHT femoral-popliteal bypass 04/18/2014   PONV (postoperative nausea and vomiting)    Retention of urine    Sepsis (HCC)    Status post bilateral cataract extraction     Surgical History: Past Surgical History:  Procedure Laterality Date   AAA s/p EVAR N/A 03/28/2014   CATARACT EXTRACTION W/ INTRAOCULAR LENS IMPLANT Bilateral    COARCTATION OF AORTA REPAIR     FEMORAL-POPLITEAL BYPASS GRAFT Right 04/18/2014   INTRAOPERATIVE CHOLANGIOGRAM  06/17/2023   Procedure: CHOLANGIOGRAM, INTRAOPERATIVE;  Surgeon: Alben Alma, MD;  Location: [facility] ORS;  Service: General;;   IR PERC CHOLECYSTOSTOMY  05/02/2023   IR RADIOLOGIST EVAL & MGMT  06/10/2023    Home Medications:  Allergies as of 08/24/2023       Reactions   Meat Extract Itching, Rash, Other (See Comments)   Red meat - causes gout   Rosuvastatin Other (See Comments)   SEVERE LEG PAIN   Allopurinol Other (See Comments)   Lethargy   Propofol  Other (See Comments)   unsure   Shellfish Allergy Other (See Comments)   According to wife. N/V  only        Medication List        Accurate as of Aug 24, 2023  1:47 PM. If you have any questions, ask your nurse or doctor.          ascorbic acid  1000 MG tablet Commonly known as: VITAMIN C  Take 1,000 mg by mouth daily.   aspirin EC 81 MG tablet Take 81 mg by mouth daily. Swallow whole.   colchicine  0.6 MG tablet Take 0.5 tablets (0.3 mg total) by mouth daily. What changed: how much to take   febuxostat  40 MG tablet Commonly known as: ULORIC  Take 80 mg by mouth daily.   finasteride  5 MG tablet Commonly known as: PROSCAR  Take 1 tablet (5 mg total) by mouth daily.   fluticasone 50 MCG/ACT nasal spray Commonly known as: FLONASE Place 2 sprays into both nostrils daily as needed for rhinitis or allergies.   lisinopril  20 MG tablet Commonly known as: ZESTRIL  Take 20 mg by mouth daily.   nystatin  powder Commonly known as: MYCOSTATIN /NYSTOP  Apply 1 Application topically 3 (three) times daily. What changed: when to take this   polyethylene glycol 17 g packet Commonly known as: MiraLax  Take 17 g by mouth daily.   Praluent 75 MG/ML Soaj Generic drug: Alirocumab Inject 75 mg into the skin every 14 (fourteen) days.   senna-docusate 8.6-50 MG  tablet Commonly known as: Senokot-S Take 2 tablets by mouth at bedtime.   tamsulosin  0.4 MG Caps capsule Commonly known as: FLOMAX  Take 1 capsule (0.4 mg total) by mouth daily.   traZODone  50 MG tablet Commonly known as: DESYREL  Take 50 mg by mouth.        Allergies:  Allergies  Allergen Reactions   Meat Extract Itching, Rash and Other (See Comments)    Red meat - causes gout   Rosuvastatin Other (See Comments)    SEVERE LEG PAIN   Allopurinol Other (See Comments)    Lethargy   Propofol  Other (See Comments)    unsure   Shellfish Allergy Other (See Comments)    According to wife. N/V only    Family History: Family History  Problem Relation Age of Onset   Diabetes Brother     Social History:    reports that he has never smoked. He has never been exposed to tobacco smoke. He has never used smokeless tobacco. He reports that he does not currently use alcohol. He reports that he does not use drugs.  Physical Exam: BP (!) 124/59   Pulse 73   Ht 6' (1.829 m)   Wt 152 lb (68.9 kg)   BMI 20.61 kg/m   Constitutional:  Alert and oriented, no acute distress, nontoxic appearing HEENT: , AT Cardiovascular: No clubbing, cyanosis, or edema Respiratory: Normal respiratory effort, no increased work of breathing Skin: No rashes, bruises or suspicious lesions Neurologic: Grossly intact, no focal deficits, moving all 4 extremities Psychiatric: Normal mood and affect  Laboratory Data: Results for orders placed or performed in visit on 08/24/23  BLADDER SCAN AMB NON-IMAGING   Collection Time: 08/24/23  3:55 PM  Result Value Ref Range   Scan Result 441 ml    Simple Catheter Placement  Due to urinary retention patient is present today for a foley cath placement.  Patient was cleaned and prepped in a sterile fashion with betadine and 2% lidocaine  jelly was instilled into the urethra. A 16 FR coud foley catheter was inserted, urine return was noted 250+ml, urine was yellow in color.  The balloon was filled with 10cc of sterile water.  A leg bag was attached for drainage. Patient tolerated well, no complications were noted   Performed by: Adrian Mcbain, PA-C   Assessment & Plan:   1. BPH with obstruction/lower urinary tract symptoms (Primary) Voiding trial failed.  I offered him Foley replacement and he agreed, see above.  We discussed various options including chronic Foley, continued Foley with repeat voiding trial allowing more time for finasteride  to take effect, or outlet procedures.  Using shared decision making, we decided to continue Flomax  and finasteride  for another month.  Will repeat a voiding trial at that time.  If unsuccessful, we will pursue PAE with IR. - BLADDER SCAN  AMB NON-IMAGING   Return in about 4 weeks (around 09/21/2023) for Voiding trial.  Adrian Pare, PA-C  [facility] [address] [place], [place] [address] [phone]

## 2023-09-10 ENCOUNTER — Ambulatory Visit: Admitting: Urology

## 2023-09-10 ENCOUNTER — Encounter: Payer: Self-pay | Admitting: Urology

## 2023-09-10 DIAGNOSIS — R8290 Unspecified abnormal findings in urine: Secondary | ICD-10-CM | POA: Diagnosis not present

## 2023-09-10 DIAGNOSIS — T839XXA Unspecified complication of genitourinary prosthetic device, implant and graft, initial encounter: Secondary | ICD-10-CM | POA: Diagnosis not present

## 2023-09-10 LAB — BLADDER SCAN AMB NON-IMAGING: Scan Result: 2

## 2023-09-10 MED ORDER — CEFUROXIME AXETIL 250 MG PO TABS: 250.0000 mg | ORAL_TABLET | Freq: Two times a day (BID) | ORAL | 0 refills | Status: AC

## 2023-09-10 NOTE — Progress Notes (Signed)
 09/10/2023 10:49 AM   Adrian Aguilar 21-Jul-1932 [id number]  Referring provider: Judge Notice, MD [address] [place],  [place] [address]  Urological history: 1. BPH w/ retention - cysto (06/2023) -enlarged prostate, moderate trabeculation with saccules - prostate volume 103 cc (CT 2025)  - Finasteride  5 mg daily and tamsulosin  0.4 mg daily - Indwelling Foley change q 30 days  2. Right renal cyst  Chief Complaint  Patient presents with   Foley catheter leaking    HPI: Adrian Aguilar is a 89 y.o. man who presents today for leaking catheter with his wife, Adrian Aguilar.    Previous records reviewed.   The patient has issues with dementia, so history is provided by his wife.  She states that last night he had soaked his pajama pants and the bedsheets and there was very little urine in the leg bag.  She has also been noticing a foul odor from the bag.  She denies any modifying or aggravating factors.  She denies any recent UTI's, gross hematuria, dysuria or suprapubic/flank pain.  She denies any fevers, chills, nausea or vomiting.     He also denied any episodes of pain.   His wife denied any sediment or mucus with the urine.  Urine in the leg bag is a clear straw yellow.  PVR 2 mL   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 2025   CKD (chronic kidney disease), stage III (HCC)    Dementia (HCC)    a.) on apoaequorin   Gout    Hypertension    PAD (peripheral artery disease) (HCC)    a.) s/p RIGHT femoral-popliteal bypass 04/18/2014   PONV (postoperative nausea and vomiting)    Retention of urine    Sepsis (HCC)    Status post bilateral cataract extraction     Surgical History: Past Surgical History:  Procedure Laterality Date    AAA s/p EVAR N/A 03/28/2014   CATARACT EXTRACTION W/ INTRAOCULAR LENS IMPLANT Bilateral    COARCTATION OF AORTA REPAIR     FEMORAL-POPLITEAL BYPASS GRAFT Right 04/18/2014   INTRAOPERATIVE CHOLANGIOGRAM  06/17/2023   Procedure: CHOLANGIOGRAM, INTRAOPERATIVE;  Surgeon: Alben Alma, MD;  Location: [facility] ORS;  Service: General;;   IR PERC CHOLECYSTOSTOMY  05/02/2023   IR RADIOLOGIST EVAL & MGMT  06/10/2023    Home Medications:  Allergies as of 09/10/2023       Reactions   Meat Extract Itching, Rash, Other (See Comments)   Red meat - causes gout   Rosuvastatin Other (See Comments)   SEVERE LEG PAIN   Allopurinol Other (See Comments)   Lethargy   Propofol  Other (See Comments)   unsure   Shellfish Allergy Other (See Comments)   According to wife. N/V only        Medication List        Accurate as of September 10, 2023 10:49 AM. If you have any questions, ask your nurse or doctor.          ascorbic acid  1000 MG tablet Commonly known as: VITAMIN C  Take 1,000 mg by mouth daily.   aspirin EC 81 MG tablet Take 81 mg by mouth daily. Swallow whole.   cefUROXime  250 MG tablet Commonly known as: CEFTIN  Take 1 tablet (250 mg  total) by mouth 2 (two) times daily with a meal for 5 days.   colchicine  0.6 MG tablet Take 0.5 tablets (0.3 mg total) by mouth daily. What changed: how much to take   febuxostat  40 MG tablet Commonly known as: ULORIC  Take 80 mg by mouth daily.   finasteride  5 MG tablet Commonly known as: PROSCAR  Take 1 tablet (5 mg total) by mouth daily.   fluticasone 50 MCG/ACT nasal spray Commonly known as: FLONASE Place 2 sprays into both nostrils daily as needed for rhinitis or allergies.   lisinopril  20 MG tablet Commonly known as: ZESTRIL  Take 20 mg by mouth daily.   nystatin  powder Commonly known as: MYCOSTATIN /NYSTOP  Apply 1 Application topically 3 (three) times daily. What changed: when to take this   polyethylene glycol 17 g packet Commonly known as:  MiraLax  Take 17 g by mouth daily.   Praluent 75 MG/ML Soaj Generic drug: Alirocumab Inject 75 mg into the skin every 14 (fourteen) days.   senna-docusate 8.6-50 MG tablet Commonly known as: Senokot-S Take 2 tablets by mouth at bedtime.   tamsulosin  0.4 MG Caps capsule Commonly known as: FLOMAX  Take 1 capsule (0.4 mg total) by mouth daily.   traZODone  50 MG tablet Commonly known as: DESYREL  Take 50 mg by mouth.        Allergies:  Allergies  Allergen Reactions   Meat Extract Itching, Rash and Other (See Comments)    Red meat - causes gout   Rosuvastatin Other (See Comments)    SEVERE LEG PAIN   Allopurinol Other (See Comments)    Lethargy   Propofol  Other (See Comments)    unsure   Shellfish Allergy Other (See Comments)    According to wife. N/V only    Family History: Family History  Problem Relation Age of Onset   Diabetes Brother     Social History:  reports that he has never smoked. He has never been exposed to tobacco smoke. He has never used smokeless tobacco. He reports that he does not currently use alcohol. He reports that he does not use drugs.  ROS: Pertinent ROS in HPI  Physical Exam: Constitutional:  Well nourished. Alert and oriented, No acute distress. HEENT: [place] AT, moist mucus membranes.  Trachea midline Cardiovascular: No clubbing, cyanosis, or edema. Respiratory: Normal respiratory effort, no increased work of breathing. GU: No CVA tenderness.  No bladder fullness or masses.  Patient with uncircumcised phallus.  Foreskin easily retracted.  Urethral meatus is patent.  No penile discharge. No penile lesions or rashes.   Foley catheter in place. Scrotum without lesions, cysts, rashes and/or edema.  Neurologic: Grossly intact, no focal deficits, moving all 4 extremities. Psychiatric: Normal mood and affect.  Laboratory Data: Lab Results  Component Value Date   WBC 9.5 06/16/2023   HGB 11.8 (L) 06/16/2023   HCT 34.9 (L) 06/16/2023   MCV 87.0  06/16/2023   PLT 510 (H) 06/16/2023   Lab Results  Component Value Date   CREATININE 1.24 06/16/2023   Lab Results  Component Value Date   TSH 4.133 04/19/2023   Lab Results  Component Value Date   AST 18 05/02/2023   Lab Results  Component Value Date   ALT 15 05/02/2023  I have reviewed the labs.   Pertinent Imaging:  09/10/23 10:39  Scan Result 2 ml    Bladder Irrigation Patient was cleaned and prepped in a sterile fashion. 70 mL of saline/sterile water was instilled into the bladder with a 70mL [place]  syringe through the catheter in place.  70 mL of urine return was cleared from the bladder.  Catheter irrigated easily.  Patient tolerated.  Assessment & Plan:    1.  Foley catheter leaking - Catheter irrigated easily here in the office - There was no sediment or debris or mucus in the urine - Explained finds the leakage of urine during the night may have been secondary to a bladder spasm, but we could not address this with medication as he is still undergoing voiding trials and OAB medications would inhibit bladder function - Explained that this may have been the result of kinking of the Foley tubing or malfunction of the leg bag and to continue to monitor - Keep voiding trial scheduled in 2 weeks  2.  Malodorous urine - Explained that it is normal to have strong smelling urine with an indwelling Foley, but wife states she has not smelled this strong of urine until now and he has had a catheter in for several months - We discussed that malodorous urine by itself without any other urinary symptoms is not concerning for urinary tract infection and she denied any other symptoms, she did however want an antibiotic to be started as the weekend is approaching, I did discuss that antibiotics by themselves have side effects, such as affecting kidneys and increasing bacterial resistance, she agreed with these risks and would still like an antibiotic prescribed - I sent a prescription  for Ceftin  250 mg twice daily for 5 days   Return for keep voiding trial scheduled .  These notes generated with voice recognition software. I apologize for typographical errors.  Briant Camper  [facility] [address]  [address] [place], [place] [address] [phone]

## 2023-09-21 ENCOUNTER — Ambulatory Visit: Admitting: Physician Assistant

## 2023-09-21 VITALS — BP 130/80 | HR 74 | Ht 73.0 in | Wt 152.0 lb

## 2023-09-21 DIAGNOSIS — R339 Retention of urine, unspecified: Secondary | ICD-10-CM

## 2023-09-21 DIAGNOSIS — N3000 Acute cystitis without hematuria: Secondary | ICD-10-CM | POA: Diagnosis not present

## 2023-09-21 DIAGNOSIS — R338 Other retention of urine: Secondary | ICD-10-CM

## 2023-09-21 DIAGNOSIS — N401 Enlarged prostate with lower urinary tract symptoms: Secondary | ICD-10-CM | POA: Diagnosis not present

## 2023-09-21 LAB — MICROSCOPIC EXAMINATION: WBC, UA: >30 /HPF — AB (ref 0–5)

## 2023-09-21 LAB — URINALYSIS, COMPLETE
Bilirubin, UA: NEGATIVE
Glucose, UA: NEGATIVE
Ketones, UA: NEGATIVE
Nitrite, UA: NEGATIVE
Protein,UA: NEGATIVE
RBC, UA: NEGATIVE
Specific Gravity, UA: 1.01 (ref 1.005–1.030)
Urobilinogen, Ur: 0.2 mg/dL (ref 0.2–1.0)
pH, UA: 6 (ref 5.0–7.5)

## 2023-09-21 LAB — BLADDER SCAN AMB NON-IMAGING: Scan Result: 382

## 2023-09-21 MED ORDER — CIPROFLOXACIN HCL 250 MG PO TABS: 250.0000 mg | ORAL_TABLET | Freq: Two times a day (BID) | ORAL | 0 refills | Status: AC

## 2023-09-21 NOTE — Progress Notes (Signed)
 09/21/2023 2:13 PM   Adrian Aguilar 1932/05/21 [id number]  CC: Chief Complaint  Patient presents with   Urinary Retention   HPI: Adrian Aguilar is a 89 y.o. male with PMH BPH with urinary retention on Flomax  and finasteride  and dementia who presents today for repeat voiding trial.   Foley removed in the morning, see separate note.  He returned to clinic in the afternoon.  He has voided several times but it is becoming more difficult as the day progresses. Additionally his wife says he has been more confused for the past several days and has concerns about possible UTI. PVR .  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 2025   CKD (chronic kidney disease), stage III (HCC)    Dementia (HCC)    a.) on apoaequorin   Gout    Hypertension    PAD (peripheral artery disease) (HCC)    a.) s/p RIGHT femoral-popliteal bypass 04/18/2014   PONV (postoperative nausea and vomiting)    Retention of urine    Sepsis (HCC)    Status post bilateral cataract extraction     Surgical History: Past Surgical History:  Procedure Laterality Date   AAA s/p EVAR N/A 03/28/2014   CATARACT EXTRACTION W/ INTRAOCULAR LENS IMPLANT Bilateral    COARCTATION OF AORTA REPAIR     FEMORAL-POPLITEAL BYPASS GRAFT Right 04/18/2014   INTRAOPERATIVE CHOLANGIOGRAM  06/17/2023   Procedure: CHOLANGIOGRAM, INTRAOPERATIVE;  Surgeon: Alben Alma, MD;  Location: [facility] ORS;  Service: General;;   IR PERC CHOLECYSTOSTOMY  05/02/2023   IR RADIOLOGIST EVAL & MGMT  06/10/2023    Home Medications:  Allergies as of 09/21/2023       Reactions   Meat Extract Itching, Rash, Other (See Comments)   Red meat - causes gout   Rosuvastatin Other (See Comments)   SEVERE LEG PAIN   Allopurinol Other (See Comments)    Lethargy   Propofol  Other (See Comments)   unsure   Shellfish Allergy Other (See Comments)   According to wife. N/V only        Medication List        Accurate as of September 21, 2023  2:13 PM. If you have any questions, ask your nurse or doctor.          ascorbic acid  1000 MG tablet Commonly known as: VITAMIN C  Take 1,000 mg by mouth daily.   aspirin EC 81 MG tablet Take 81 mg by mouth daily. Swallow whole.   colchicine  0.6 MG tablet Take 0.5 tablets (0.3 mg total) by mouth daily. What changed: how much to take   febuxostat  40 MG tablet Commonly known as: ULORIC  Take 80 mg by mouth daily.   finasteride  5 MG tablet Commonly known as: PROSCAR  Take 1 tablet (5 mg total) by mouth daily.   fluticasone 50 MCG/ACT nasal spray Commonly known as: FLONASE Place 2 sprays into both nostrils daily as needed for rhinitis or allergies.   lisinopril  20 MG tablet Commonly known as: ZESTRIL  Take 20 mg by mouth daily.   nystatin  powder Commonly known as: MYCOSTATIN /NYSTOP  Apply 1 Application topically 3 (three) times daily. What changed: when to take this   polyethylene glycol 17 g packet Commonly known as: MiraLax  Take 17 g by mouth daily.   Praluent 75 MG/ML  Soaj Generic drug: Alirocumab Inject 75 mg into the skin every 14 (fourteen) days.   senna-docusate 8.6-50 MG tablet Commonly known as: Senokot-S Take 2 tablets by mouth at bedtime.   tamsulosin  0.4 MG Caps capsule Commonly known as: FLOMAX  Take 1 capsule (0.4 mg total) by mouth daily.   traZODone  50 MG tablet Commonly known as: DESYREL  Take 50 mg by mouth.        Allergies:  Allergies  Allergen Reactions   Meat Extract Itching, Rash and Other (See Comments)    Red meat - causes gout   Rosuvastatin Other (See Comments)    SEVERE LEG PAIN   Allopurinol Other (See Comments)    Lethargy   Propofol  Other (See Comments)    unsure   Shellfish Allergy Other (See Comments)    According to wife. N/V only     Family History: Family History  Problem Relation Age of Onset   Diabetes Brother     Social History:   reports that he has never smoked. He has never been exposed to tobacco smoke. He has never used smokeless tobacco. He reports that he does not currently use alcohol. He reports that he does not use drugs.  Physical Exam: BP 130/80   Pulse 74   Ht 6' 1 (1.854 m)   Wt 152 lb (68.9 kg)   BMI 20.05 kg/m   Constitutional:  Alert, no acute distress, nontoxic appearing HEENT: [place], AT Cardiovascular: No clubbing, cyanosis, or edema Respiratory: Normal respiratory effort, no increased work of breathing Skin: No rashes, bruises or suspicious lesions Neurologic: Grossly intact, no focal deficits, moving all 4 extremities Psychiatric: Normal mood and affect  Laboratory Data: Results for orders placed or performed in visit on 09/21/23  Bladder Scan (Post Void Residual) in office   Collection Time: 09/21/23  3:27 PM  Result Value Ref Range   Scan Result 382    Simple Catheter Placement  Due to urinary retention patient is present today for a foley cath placement.  Patient was cleaned and prepped in a sterile fashion with betadine and 2% lidocaine  jelly was instilled into the urethra. A 16 FR coude foley catheter was inserted, urine return was noted  300+ml, urine was yellow in color.  The balloon was filled with 10cc of sterile water.  A leg bag was attached for drainage. Patient was also given a night bag to take home.  Patient was given instruction on proper catheter care.  Patient tolerated well, no complications were noted   Performed by: Deja Pisarski, PA-C and Jessica Qualls, CMA   Assessment & Plan:   1. Benign prostatic hyperplasia with urinary retention (Primary) Voiding trial failed. Offered them Foley replacement and they agreed, see above. They want to pursue PAE at this time; will refer to IR. I also gave them a printout about the procedure. - Bladder Scan (Post  Void Residual) in office - Ambulatory referral to Interventional Radiology  2. Acute cystitis without hematuria Urine sample obtained from catheter today for UA/CX. Will start empiric Cipro while we await results. - Urinalysis, Complete - CULTURE, URINE COMPREHENSIVE - ciprofloxacin (CIPRO) 250 MG tablet; Take 1 tablet (250 mg total) by mouth 2 (two) times daily for 5 days.  Dispense: 10 tablet; Refill: 0   Return if symptoms worsen or fail to improve.  Kathreen Pare, PA-C  [facility] [place] [address], [address] [place], [place] [address] [phone]

## 2023-09-21 NOTE — Progress Notes (Signed)
 Catheter Removal  Patient is present today for a catheter removal.  7ml of water was drained from the balloon. A 16FR foley cath was removed from the bladder, no complications were noted. Patient tolerated well.  Performed by: Ples Specter CMA   Follow up/ Additional notes:This afternoon

## 2023-09-29 DIAGNOSIS — R8281 Pyuria: Secondary | ICD-10-CM

## 2023-09-29 LAB — CULTURE, URINE COMPREHENSIVE

## 2023-09-30 MED ORDER — CIPROFLOXACIN HCL 250 MG PO TABS: 250.0000 mg | ORAL_TABLET | Freq: Two times a day (BID) | ORAL | 0 refills | Status: AC

## 2023-10-05 ENCOUNTER — Other Ambulatory Visit: Payer: Self-pay | Admitting: Physician Assistant

## 2023-10-05 DIAGNOSIS — K5792 Diverticulitis of intestine, part unspecified, without perforation or abscess without bleeding: Secondary | ICD-10-CM | POA: Insufficient documentation

## 2023-10-05 DIAGNOSIS — N401 Enlarged prostate with lower urinary tract symptoms: Secondary | ICD-10-CM

## 2023-10-05 DIAGNOSIS — H269 Unspecified cataract: Secondary | ICD-10-CM | POA: Insufficient documentation

## 2023-10-05 DIAGNOSIS — E7800 Pure hypercholesterolemia, unspecified: Secondary | ICD-10-CM | POA: Insufficient documentation

## 2023-10-05 DIAGNOSIS — R7301 Impaired fasting glucose: Secondary | ICD-10-CM | POA: Insufficient documentation

## 2023-10-07 ENCOUNTER — Ambulatory Visit
Admission: RE | Admit: 2023-10-07 | Discharge: 2023-10-07 | Disposition: A | Discharge status: Home / Self Care | Source: Ambulatory Visit | Attending: Physician Assistant | Admitting: Physician Assistant

## 2023-10-07 DIAGNOSIS — N401 Enlarged prostate with lower urinary tract symptoms: Secondary | ICD-10-CM

## 2023-10-07 HISTORY — PX: IR RADIOLOGIST EVAL & MGMT: IMG5224

## 2023-10-07 NOTE — Consult Note (Signed)
 Chief Complaint: Patient was seen in consultation today for Foley catheter dependence in the setting of BPH at the request of Vaillancourt,Samantha  Referring Physician(s): Vaillancourt,Samantha  History of Present Illness: Adrian Aguilar is a 89 y.o. male who was in his usual state of relatively good health until January of this year when he developed sepsis and was ultimately diagnosed with cholecystitis.  In fact, I placed a percutaneous cholecystostomy tube for him in the hospital on May 02, 2023.  Since that time, he has undergone completion cholecystectomy and has also had a tooth extracted recently.  During the course of his medical issues he required placement of a Foley catheter and has become Foley catheter dependent after failing several trials at removal.  He has been evaluated by urology and the source of his urinary retention is BPH.  He has already attempted a 46-month trial of Flomax  and finasteride  but remains Foley catheter dependent.  Due to his advanced age, the family is hesitant to pursue any surgical treatments for BPH.  Therefore, he presents today to discuss the possibility of prostatic artery embolization.  He does have a history of significant peripheral arterial disease and abdominal aortic aneurysm status post endovascular aortic repair with a bifurcated endoprosthesis.  This does complicate our approach for prostate artery embolization but does not exclude treatment entirely.  Past Medical History:  Diagnosis Date   AAA (abdominal aortic aneurysm) (HCC)    a.) s/p EVAR 03/28/2014   Aortic atherosclerosis (HCC)    BPH (benign prostatic hyperplasia)    Chronic cholecystitis due to gallbladder calculus with obstruction 2025   CKD (chronic kidney disease), stage III (HCC)    Dementia (HCC)    a.) on apoaequorin   Gout    Hypertension    PAD (peripheral artery disease) (HCC)    a.) s/p RIGHT femoral-popliteal bypass 04/18/2014   PONV (postoperative  nausea and vomiting)    Retention of urine    Sepsis (HCC)    Status post bilateral cataract extraction     Past Surgical History:  Procedure Laterality Date   AAA s/p EVAR N/A 03/28/2014   CATARACT EXTRACTION W/ INTRAOCULAR LENS IMPLANT Bilateral    COARCTATION OF AORTA REPAIR     FEMORAL-POPLITEAL BYPASS GRAFT Right 04/18/2014   INTRAOPERATIVE CHOLANGIOGRAM  06/17/2023   Procedure: CHOLANGIOGRAM, INTRAOPERATIVE;  Surgeon: Jordis Laneta FALCON, MD;  Location: [facility] ORS;  Service: General;;   IR PERC CHOLECYSTOSTOMY  05/02/2023   IR RADIOLOGIST EVAL & MGMT  06/10/2023    Allergies: Allopurinol, Meat extract, Rosuvastatin, Shellfish allergy, and Propofol   Medications: Prior to Admission medications   Medication Sig Start Date End Date Taking? Authorizing Provider  ondansetron  (ZOFRAN ) 4 MG tablet Take 4 mg by mouth. 06/17/23  Yes [provider]  ascorbic acid  (VITAMIN C ) 1000 MG tablet Take 1,000 mg by mouth daily. 12/10/17   [provider]  aspirin EC 81 MG tablet Take 81 mg by mouth daily. Swallow whole.    [provider]  colchicine  0.6 MG tablet Take 0.5 tablets (0.3 mg total) by mouth daily. Patient taking differently: Take 0.6 mg by mouth daily. 04/22/23   Dorinda Drue DASEN, MD  febuxostat  (ULORIC ) 40 MG tablet Take 80 mg by mouth daily.    [provider]  finasteride  (PROSCAR ) 5 MG tablet Take 1 tablet (5 mg total) by mouth daily. 06/18/23   Penne Knee, MD  fluticasone [facility]) 50 MCG/ACT nasal spray Place 2 sprays into both nostrils daily as needed  for rhinitis or allergies. 07/15/18   [provider]  lisinopril  (ZESTRIL ) 20 MG tablet Take 20 mg by mouth daily.    [provider]  nystatin  (MYCOSTATIN /NYSTOP ) powder Apply 1 Application topically 3 (three) times daily. Patient taking differently: Apply 1 Application topically daily. 05/13/23   Penne Knee, MD  polyethylene glycol (MIRALAX ) 17 g packet Take 17 g by mouth daily.  04/18/23   Krishnan, Gokul, MD  PRALUENT 75 MG/ML SOAJ Inject 75 mg into the skin every 14 (fourteen) days.    [provider]  senna-docusate (SENOKOT-S) 8.6-50 MG tablet Take 2 tablets by mouth at bedtime. 04/18/23   Krishnan, Gokul, MD  tamsulosin  (FLOMAX ) 0.4 MG CAPS capsule Take 1 capsule (0.4 mg total) by mouth daily. 05/13/23   Helon Kirsch A, PA-C  traZODone  (DESYREL ) 50 MG tablet Take 50 mg by mouth. 07/09/23   [provider]     Family History  Problem Relation Age of Onset   Diabetes Brother     Social History   Socioeconomic History   Marital status: Married    Spouse name: Cerasoli,Debra (Spouse)   Number of children: Not on file   Years of education: Not on file   Highest education level: Not on file  Occupational History   Not on file  Tobacco Use   Smoking status: Never    Passive exposure: Never   Smokeless tobacco: Never  Substance and Sexual Activity   Alcohol use: Not Currently   Drug use: Never   Sexual activity: Not Currently  Other Topics Concern   Not on file  Social History Narrative   Not on file   Social Drivers of Health   Financial Resource Strain: Low Risk  (05/07/2023)   Received from [facility]   Overall Financial Resource Strain (CARDIA)    Difficulty of Paying Living Expenses: Not hard at all  Food Insecurity: No Food Insecurity (05/07/2023)   Received from [facility]   Hunger Vital Sign    Within the past 12 months, you worried that your food would run out before you got the money to buy more.: Never true    Within the past 12 months, the food you bought just didn't last and you didn't have money to get more.: Never true  Transportation Needs: No Transportation Needs (05/07/2023)   Received from [facility] - Transportation    Lack of Transportation (Medical): No    Lack of Transportation (Non-Medical): No  Physical Activity: Unknown (05/07/2023)   Received from [facility]   Exercise Vital Sign     On average, how many days per week do you engage in moderate to strenuous exercise (like a brisk walk)?: Patient declined    On average, how many minutes do you engage in exercise at this level?: 10 min  Stress: No Stress Concern Present (05/07/2023)   Received from [facility] - Occupational Stress Questionnaire    Feeling of Stress : Not at all  Social Connections: Moderately Integrated (05/07/2023)   Received from [facility]   Social Network    How would you rate your social network (family, work, friends)?: Adequate participation with social networks  Recent Concern: Social Connections - Moderately Isolated (04/20/2023)   Social Connection and Isolation Panel    Frequency of Communication with Friends and Family: Once a week    Frequency of Social Gatherings with Friends and Family: Once a week    Attends  Religious Services: 1 to 4 times per year    Active Member of Clubs or Organizations: No    Attends Banker Meetings: Never    Marital Status: Married    Review of Systems: A 12 point ROS discussed and pertinent positives are indicated in the HPI above.  All other systems are negative.  Review of Systems  Vital Signs: BP (!) 139/58 (BP Location: Right Arm, Patient Position: Sitting)   Pulse 69   Temp 97.8 F (36.6 C) (Oral)   Resp 16   SpO2 100%   Advance Care Plan: The advanced care plan/surrogate decision maker was discussed at the time of visit and the patient did not wish to discuss or was not able to name a surrogate decision maker or provide an advance care plan.    Physical Exam Constitutional:      General: He is not in acute distress.    Appearance: Normal appearance. He is normal weight.  HENT:     Head: Normocephalic and atraumatic.  Eyes:     General: No scleral icterus. Cardiovascular:     Rate and Rhythm: Normal rate.  Pulmonary:     Effort: Pulmonary effort is normal.  Abdominal:     General:  Abdomen is flat. There is no distension.     Tenderness: There is no abdominal tenderness.  Skin:    General: Skin is warm and dry.     Findings: Bruising: Left periorbityal bruising.  Neurological:     Mental Status: He is alert and oriented to person, place, and time.  Psychiatric:        Behavior: Behavior normal.       Imaging: No results found.  Labs:  CBC: Recent Labs    05/03/23 0536 05/04/23 0535 05/28/23 1056 06/16/23 0804  WBC 28.2* 13.9* 24.7* 9.5  HGB 11.3* 10.4* 12.0* 11.8*  HCT 31.9* 29.2* 33.0* 34.9*  PLT 367 357 369 510*    COAGS: Recent Labs    04/17/23 1431 05/01/23 2137 05/02/23 0411  INR 1.0 1.1 1.3*  APTT  --  22*  --     BMP: Recent Labs    05/03/23 0536 05/04/23 0535 05/28/23 1056 06/16/23 0804  NA 131* 133* 123* 136  K 4.1 3.4* 4.5 3.4*  CL 101 104 90* 101  CO2 21* 22 23 26   GLUCOSE 108* 102* 167* 80  BUN 33* 30* 20 20  CALCIUM 8.6* 8.4* 9.1 9.4  CREATININE 1.63* 1.50* 1.52* 1.24  GFRNONAA 40* 45* 44* 56*    LIVER FUNCTION TESTS: Recent Labs    04/17/23 1431 05/01/23 2137 05/02/23 0411  BILITOT 0.9 1.3* 1.1  AST 27 21 18   ALT 21 18 15   ALKPHOS 85 71 62  PROT 8.2* 8.1 6.8  ALBUMIN 4.8 4.1 3.5    TUMOR MARKERS: No results for input(s): AFPTM, CEA, CA199, CHROMGRNA in the last 8760 hours.  Assessment and Plan:  Very pleasant 89 year old gentleman with benign prostatic hyperplasia and severe lower urinary tract symptoms with Foley catheter dependence.  He has failed a conservative trial of Flomax  plus finasteride  for the past 3 months.  His current [place] quality-of-life score is 6, terrible.  He does have a prior history of endovascular aortic repair of an abdominal aortic aneurysm which will complicate the approach for prostate artery embolization.  Further, he has extensive significant atherosclerotic vascular plaque and may not have a targetable prostatic arteries.  However, we will not know until we make  an attempt.  The risks, benefits and alternatives were discussed in detail.  He and his daughter understand that the procedure may not be successful given his underlying vascular disease.  After discussing with Mr. Ancheta and his daughter, they agreed to proceed with an attempted prostate artery embolization.  1.)  Please schedule for prostate artery embolization to be performed by me at [facility].  We will use a left radial approach.    Thank you for this interesting consult.  I greatly enjoyed meeting Adrian Aguilar and look forward to participating in their care.  A copy of this report was sent to the requesting provider on this date.  Electronically Signed: Wilkie MARLA Lent 10/07/2023, 8:50 AM   I spent a total of  40 Minutes  in face to face in clinical consultation, greater than 50% of which was counseling/coordinating care for BPH with LUTS and Foley catheter dependence.

## 2023-10-12 DIAGNOSIS — R8290 Unspecified abnormal findings in urine: Secondary | ICD-10-CM

## 2023-10-13 ENCOUNTER — Other Ambulatory Visit: Payer: Self-pay | Admitting: Interventional Radiology

## 2023-10-13 DIAGNOSIS — N138 Other obstructive and reflux uropathy: Secondary | ICD-10-CM

## 2023-10-13 MED ORDER — FOSFOMYCIN TROMETHAMINE 3 G PO PACK: 3.0000 g | PACK | Freq: Once | ORAL | 0 refills | Status: AC

## 2023-10-29 ENCOUNTER — Other Ambulatory Visit: Payer: Self-pay | Admitting: Radiology

## 2023-10-29 DIAGNOSIS — N400 Enlarged prostate without lower urinary tract symptoms: Secondary | ICD-10-CM

## 2023-10-29 NOTE — Progress Notes (Signed)
 Patient for IR PAE on Monday 11/01/23, I called and spoke with the patient's wife, Adrien on the phone and gave pre-procedure instructions. Adrien was made aware to have the patient here at 7:30a, last dose of ASA 81mg  was Tues 10/26/23, NPO after MN prior to procedure as well as driver post procedure/recovery/discharge. Adrien stated understanding.  Called 10/28/23

## 2023-10-29 NOTE — H&P (Signed)
 Chief Complaint: Patient was seen in consultation today for benign prostatic hyperplasia with foley catheter dependence  Procedure: Prostate Artery Embolization  Referring Physician(s): Lucie Hones, PA-C  Supervising Physician: Karalee Beat  Patient Status: [facility] - Out-pt  History of Present Illness: Adrian Aguilar is a 89 y.o. male with a history of dementia, CKD III, abdominal aortic aneurysm, atherosclerosis, PAD, and BPH with foley catheter dependence known to IR from previous cholecystostomy tube placement on 1/26 with Dr. JONETTA Faes. He has since undergone cholecystectomy on 3/13. He initially had a foley placed following a hospital admission in early January for various issues including urinary retention and has been following with Urology since. Unfortunately patient has failed multiple attempts at Foley catheter removal as well as a 54-month trial of Flomax  and finasteride . Family is not interested in pursuing surgical options at this time. IR consulted for possible prostatic artery embolization. Patient was evaluated by Dr. VEAR Karalee in clinic. He does have a significant history of PAD and AAA s/p endovascular aortic repair with bifurcated endoprosthesis.   Patient resting in bed with family at the bedside. Wife states that he has been doing well overall she just hopes today's procedure will go well. Patient denies any pain anywhere, difficulty breathing, or chest pain. He has been NPO since midnight. All questions and concerns answered at the bedside.   Code Status: Full Code  Past Medical History:  Diagnosis Date   AAA (abdominal aortic aneurysm) (HCC)    a.) s/p EVAR 03/28/2014   Aortic atherosclerosis (HCC)    BPH (benign prostatic hyperplasia)    Chronic cholecystitis due to gallbladder calculus with obstruction 2025   CKD (chronic kidney disease), stage III (HCC)    Dementia (HCC)    a.) on apoaequorin   Gout    Hypertension    PAD (peripheral  artery disease) (HCC)    a.) s/p RIGHT femoral-popliteal bypass 04/18/2014   PONV (postoperative nausea and vomiting)    Retention of urine    Sepsis (HCC)    Status post bilateral cataract extraction     Past Surgical History:  Procedure Laterality Date   AAA s/p EVAR N/A 03/28/2014   CATARACT EXTRACTION W/ INTRAOCULAR LENS IMPLANT Bilateral    COARCTATION OF AORTA REPAIR     FEMORAL-POPLITEAL BYPASS GRAFT Right 04/18/2014   INTRAOPERATIVE CHOLANGIOGRAM  06/17/2023   Procedure: CHOLANGIOGRAM, INTRAOPERATIVE;  Surgeon: Jordis Laneta FALCON, MD;  Location: [facility] ORS;  Service: General;;   IR PERC CHOLECYSTOSTOMY  05/02/2023   IR RADIOLOGIST EVAL & MGMT  06/10/2023   IR RADIOLOGIST EVAL & MGMT  10/07/2023    Allergies: Allopurinol, Meat extract, Rosuvastatin, Shellfish allergy, and Propofol   Medications: Prior to Admission medications   Medication Sig Start Date End Date Taking? Authorizing Provider  ascorbic acid  (VITAMIN C ) 1000 MG tablet Take 1,000 mg by mouth daily. 12/10/17   [provider]  aspirin EC 81 MG tablet Take 81 mg by mouth daily. Swallow whole.    [provider]  colchicine  0.6 MG tablet Take 0.5 tablets (0.3 mg total) by mouth daily. Patient taking differently: Take 0.6 mg by mouth daily. 04/22/23   Dorinda Drue DASEN, MD  febuxostat  (ULORIC ) 40 MG tablet Take 80 mg by mouth daily.    [provider]  finasteride  (PROSCAR ) 5 MG tablet Take 1 tablet (5 mg total) by mouth daily. 06/18/23   Penne Knee, MD  fluticasone [facility]) 50 MCG/ACT nasal spray Place 2 sprays into both nostrils daily as needed  for rhinitis or allergies. 07/15/18   [provider]  lisinopril  (ZESTRIL ) 20 MG tablet Take 20 mg by mouth daily.    [provider]  nystatin  (MYCOSTATIN /NYSTOP ) powder Apply 1 Application topically 3 (three) times daily. Patient taking differently: Apply 1 Application topically daily. 05/13/23   Penne Knee, MD  ondansetron  (ZOFRAN )  4 MG tablet Take 4 mg by mouth. 06/17/23   [provider]  polyethylene glycol (MIRALAX ) 17 g packet Take 17 g by mouth daily. 04/18/23   Krishnan, Gokul, MD  PRALUENT 75 MG/ML SOAJ Inject 75 mg into the skin every 14 (fourteen) days.    [provider]  senna-docusate (SENOKOT-S) 8.6-50 MG tablet Take 2 tablets by mouth at bedtime. 04/18/23   Krishnan, Gokul, MD  tamsulosin  (FLOMAX ) 0.4 MG CAPS capsule Take 1 capsule (0.4 mg total) by mouth daily. 05/13/23   Helon Kirsch A, PA-C  traZODone  (DESYREL ) 50 MG tablet Take 50 mg by mouth. 07/09/23   [provider]     Family History  Problem Relation Age of Onset   Diabetes Brother     Social History   Socioeconomic History   Marital status: Married    Spouse name: Kise,Debra (Spouse)   Number of children: Not on file   Years of education: Not on file   Highest education level: Not on file  Occupational History   Not on file  Tobacco Use   Smoking status: Never    Passive exposure: Never   Smokeless tobacco: Never  Substance and Sexual Activity   Alcohol use: Not Currently   Drug use: Never   Sexual activity: Not Currently  Other Topics Concern   Not on file  Social History Narrative   Not on file   Social Drivers of Health   Financial Resource Strain: Low Risk  (05/07/2023)   Received from [facility]   Overall Financial Resource Strain (CARDIA)    Difficulty of Paying Living Expenses: Not hard at all  Food Insecurity: No Food Insecurity (05/07/2023)   Received from [facility]   Hunger Vital Sign    Within the past 12 months, you worried that your food would run out before you got the money to buy more.: Never true    Within the past 12 months, the food you bought just didn't last and you didn't have money to get more.: Never true  Transportation Needs: No Transportation Needs (05/07/2023)   Received from [facility] - Transportation    Lack of Transportation (Medical): No     Lack of Transportation (Non-Medical): No  Physical Activity: Unknown (05/07/2023)   Received from [facility]   Exercise Vital Sign    On average, how many days per week do you engage in moderate to strenuous exercise (like a brisk walk)?: Patient declined    On average, how many minutes do you engage in exercise at this level?: 10 min  Stress: No Stress Concern Present (05/07/2023)   Received from [facility] - Occupational Stress Questionnaire    Feeling of Stress : Not at all  Social Connections: Moderately Integrated (05/07/2023)   Received from [facility]   Social Network    How would you rate your social network (family, work, friends)?: Adequate participation with social networks  Recent Concern: Social Connections - Moderately Isolated (04/20/2023)   Social Connection and Isolation Panel    Frequency of Communication with Friends and Family: Once a week  Frequency of Social Gatherings with Friends and Family: Once a week    Attends Religious Services: 1 to 4 times per year    Active Member of [employer] or Organizations: No    Attends Banker Meetings: Never    Marital Status: Married    Review of Systems Denies any N/V, chest pain, shortness of breath, fevers/chills. All other ROS negative.  Vital Signs: BP (!) 164/73   Pulse 69   Temp 97.7 F (36.5 C) (Oral)   Resp 13   Ht 6' (1.829 m)   Wt 154 lb (69.9 kg)   SpO2 98%   BMI 20.89 kg/m    Physical Exam Vitals reviewed.  Constitutional:      Appearance: Normal appearance.  HENT:     Head: Normocephalic and atraumatic.     Mouth/Throat:     Mouth: Mucous membranes are moist.     Pharynx: Oropharynx is clear.  Cardiovascular:     Rate and Rhythm: Normal rate and regular rhythm.     Pulses: Normal pulses.     Heart sounds: Normal heart sounds.     Comments: Bilateral radial pulses 2+ Pulmonary:     Effort: Pulmonary effort is normal.     Breath  sounds: Normal breath sounds.  Abdominal:     General: Abdomen is flat.     Palpations: Abdomen is soft.     Tenderness: There is no abdominal tenderness.  Genitourinary:    Comments: Foley in place Skin:    General: Skin is warm and dry.  Neurological:     General: No focal deficit present.     Mental Status: He is alert and oriented to person, place, and time. Mental status is at baseline.  Psychiatric:        Mood and Affect: Mood normal.        Behavior: Behavior normal.        Judgment: Judgment normal.     Imaging: IR Radiologist Eval & Mgmt Result Date: 10/07/2023 EXAM: NEW PATIENT OFFICE VISIT CHIEF COMPLAINT: SEE NOTE IN EPIC HISTORY OF PRESENT ILLNESS: SEE NOTE IN EPIC REVIEW OF SYSTEMS: SEE NOTE IN EPIC PHYSICAL EXAMINATION: SEE NOTE IN EPIC ASSESSMENT AND PLAN: SEE NOTE IN EPIC Electronically Signed   By: Wilkie Lent M.D.   On: 10/07/2023 08:48    Labs:  CBC: Recent Labs    05/04/23 0535 05/28/23 1056 06/16/23 0804 11/01/23 0741  WBC 13.9* 24.7* 9.5 10.2  HGB 10.4* 12.0* 11.8* 12.3*  HCT 29.2* 33.0* 34.9* 34.3*  PLT 357 369 510* 332    COAGS: Recent Labs    04/17/23 1431 05/01/23 2137 05/02/23 0411 11/01/23 0741  INR 1.0 1.1 1.3* 1.0  APTT  --  22*  --   --     BMP: Recent Labs    05/04/23 0535 05/28/23 1056 06/16/23 0804 11/01/23 0741  NA 133* 123* 136 132*  K 3.4* 4.5 3.4* 5.0  CL 104 90* 101 96*  CO2 22 23 26 22   GLUCOSE 102* 167* 80 111*  BUN 30* 20 20 23   CALCIUM 8.4* 9.1 9.4 9.8  CREATININE 1.50* 1.52* 1.24 1.62*  GFRNONAA 45* 44* 56* 41*    LIVER FUNCTION TESTS: Recent Labs    04/17/23 1431 05/01/23 2137 05/02/23 0411  BILITOT 0.9 1.3* 1.1  AST 27 21 18   ALT 21 18 15   ALKPHOS 85 71 62  PROT 8.2* 8.1 6.8  ALBUMIN 4.8 4.1 3.5    TUMOR MARKERS: No  results for input(s): AFPTM, CEA, CA199, CHROMGRNA in the last 8760 hours.  Assessment and Plan:  Benign Prostate Hyperplasia w/ foley catheter dependence:  Twan Harkin is a 89 y.o. male with a history of PAD and AAA s/p endovascular aortic repair with bifurcated endoprosthesis and BPH with foley dependence presenting to [facility] for   image-guided prostate artery embolization with Dr. VEAR Lent. Procedure to be performed under moderate sedation.  -NPO since midnight -IV Zofran  ordered d/t nausea w/ contrast injections -eGFR 41, Cr 1.62, BUN 23 -Plan for possible prostate artery embolization   The Risks and benefits of embolization were discussed with the patient including, but not limited to bleeding, infection, vascular injury, post operative pain, or contrast induced renal failure.  This procedure involves the use of X-rays and because of the nature of the planned procedure, it is possible that we will have prolonged use of X-ray fluoroscopy.  Potential radiation risks to you include (but are not limited to) the following: - A slightly elevated risk for cancer several years later in life. This risk is typically less than 0.5% percent. This risk is low in comparison to the normal incidence of human cancer, which is 33% for women and 50% for men according to the American Cancer Society. - Radiation induced injury can include skin redness, resembling a rash, tissue breakdown / ulcers and hair loss (which can be temporary or permanent).   The likelihood of either of these occurring depends on the difficulty of the procedure and whether you are sensitive to radiation due to previous procedures, disease, or genetic conditions.   IF your procedure requires a prolonged use of radiation, you will be notified and given written instructions for further action.  It is your responsibility to monitor the irradiated area for the 2 weeks following the procedure and to notify your physician if you are concerned that you have suffered a radiation induced injury.    All of the patient's questions were answered, patient is agreeable to proceed. Consent signed  and in chart.   Thank you for this interesting consult. I greatly enjoyed meeting Habeeb Puertas and look forward to participating in their care. A copy of this report was sent to the requesting provider on this date.  Electronically Signed: Glennon CHRISTELLA Bal, PA-C 11/01/2023, 8:36 AM   I spent a total of 30 Minutes in face to face clinical consultation, greater than 50% of which was counseling/coordinating care for prostate artery embolization.

## 2023-11-01 ENCOUNTER — Other Ambulatory Visit: Payer: Self-pay

## 2023-11-01 ENCOUNTER — Ambulatory Visit
Admission: RE | Admit: 2023-11-01 | Discharge: 2023-11-01 | Disposition: A | Discharge status: Home / Self Care | Source: Ambulatory Visit | Attending: Interventional Radiology | Admitting: Interventional Radiology

## 2023-11-01 VITALS — BP 168/88 | HR 63 | Temp 97.0°F | Resp 14 | Ht 72.0 in | Wt 154.0 lb

## 2023-11-01 DIAGNOSIS — F0390 Unspecified dementia without behavioral disturbance: Secondary | ICD-10-CM | POA: Diagnosis not present

## 2023-11-01 DIAGNOSIS — I739 Peripheral vascular disease, unspecified: Secondary | ICD-10-CM | POA: Diagnosis not present

## 2023-11-01 DIAGNOSIS — N138 Other obstructive and reflux uropathy: Secondary | ICD-10-CM | POA: Insufficient documentation

## 2023-11-01 DIAGNOSIS — I129 Hypertensive chronic kidney disease with stage 1 through stage 4 chronic kidney disease, or unspecified chronic kidney disease: Secondary | ICD-10-CM | POA: Diagnosis not present

## 2023-11-01 DIAGNOSIS — N183 Chronic kidney disease, stage 3 unspecified: Secondary | ICD-10-CM | POA: Diagnosis not present

## 2023-11-01 DIAGNOSIS — I708 Atherosclerosis of other arteries: Secondary | ICD-10-CM | POA: Insufficient documentation

## 2023-11-01 DIAGNOSIS — N401 Enlarged prostate with lower urinary tract symptoms: Secondary | ICD-10-CM | POA: Insufficient documentation

## 2023-11-01 DIAGNOSIS — N400 Enlarged prostate without lower urinary tract symptoms: Secondary | ICD-10-CM

## 2023-11-01 DIAGNOSIS — Z79899 Other long term (current) drug therapy: Secondary | ICD-10-CM | POA: Insufficient documentation

## 2023-11-01 DIAGNOSIS — I714 Abdominal aortic aneurysm, without rupture, unspecified: Secondary | ICD-10-CM | POA: Insufficient documentation

## 2023-11-01 HISTORY — PX: IR EMBO TUMOR ORGAN ISCHEMIA INFARCT INC GUIDE ROADMAPPING: IMG5449

## 2023-11-01 LAB — BASIC METABOLIC PANEL WITH GFR
Anion gap: 14 (ref 5–15)
BUN: 23 mg/dL (ref 8–23)
CO2: 22 mmol/L (ref 22–32)
Calcium: 9.8 mg/dL (ref 8.9–10.3)
Chloride: 96 mmol/L — ABNORMAL LOW (ref 98–111)
Creatinine, Ser: 1.62 mg/dL — ABNORMAL HIGH (ref 0.61–1.24)
GFR, Estimated: 41 mL/min — ABNORMAL LOW (ref >60)
Glucose, Bld: 111 mg/dL — ABNORMAL HIGH (ref 70–99)
Potassium: 5 mmol/L (ref 3.5–5.1)
Sodium: 132 mmol/L — ABNORMAL LOW (ref 135–145)

## 2023-11-01 LAB — CBC
HCT: 34.3 % — ABNORMAL LOW (ref 39.0–52.0)
Hemoglobin: 12.3 g/dL — ABNORMAL LOW (ref 13.0–17.0)
MCH: 30.1 pg (ref 26.0–34.0)
MCHC: 35.9 g/dL (ref 30.0–36.0)
MCV: 84.1 fL (ref 80.0–100.0)
Platelets: 332 K/uL (ref 150–400)
RBC: 4.08 MIL/uL — ABNORMAL LOW (ref 4.22–5.81)
RDW: 12.6 % (ref 11.5–15.5)
WBC: 10.2 K/uL (ref 4.0–10.5)
nRBC: 0 % (ref 0.0–0.2)

## 2023-11-01 LAB — PROTIME-INR
INR: 1 (ref 0.8–1.2)
Prothrombin Time: 14.3 s (ref 11.4–15.2)

## 2023-11-01 MED ORDER — MIDAZOLAM HCL 2 MG/2ML IJ SOLN
INTRAMUSCULAR | Status: AC | PRN
Start: 2023-11-01 — End: 2023-11-01
  Administered 2023-11-01: .5 mg via INTRAVENOUS

## 2023-11-01 MED ORDER — MIDAZOLAM HCL 2 MG/2ML IJ SOLN
INTRAMUSCULAR | Status: AC
  Filled 2023-11-01: qty 2

## 2023-11-01 MED ORDER — VERAPAMIL HCL 2.5 MG/ML IV SOLN
INTRAVENOUS | Status: AC
  Filled 2023-11-01: qty 2

## 2023-11-01 MED ORDER — NITROGLYCERIN 1 MG/10 ML FOR IR/CATH LAB
INTRA_ARTERIAL | Status: AC
  Filled 2023-11-01: qty 10

## 2023-11-01 MED ORDER — ONDANSETRON HCL 4 MG/2ML IJ SOLN
INTRAMUSCULAR | Status: AC
  Filled 2023-11-01: qty 2

## 2023-11-01 MED ORDER — HEPARIN SODIUM (PORCINE) 1000 UNIT/ML IJ SOLN
INTRAMUSCULAR | Status: AC
  Filled 2023-11-01: qty 10

## 2023-11-01 MED ORDER — MIDAZOLAM HCL 5 MG/5ML IJ SOLN
INTRAMUSCULAR | Status: AC | PRN
  Administered 2023-11-01: .5 mg via INTRAVENOUS

## 2023-11-01 MED ORDER — ONDANSETRON HCL 4 MG/2ML IJ SOLN: 4.0000 mg | Freq: Once | INTRAMUSCULAR | Status: DC

## 2023-11-01 MED ORDER — LIDOCAINE HCL 1 % IJ SOLN
INTRAMUSCULAR | Status: AC
Start: 2023-11-01 — End: 2023-11-01
  Filled 2023-11-01: qty 20

## 2023-11-01 MED ORDER — LEVOFLOXACIN IN D5W 250 MG/50ML IV SOLN
INTRAVENOUS | Status: AC | PRN
  Administered 2023-11-01: 500 mg via INTRAVENOUS

## 2023-11-01 MED ORDER — SODIUM CHLORIDE 0.9 % IV SOLN: INTRAVENOUS | Status: DC

## 2023-11-01 MED ORDER — FENTANYL CITRATE (PF) 100 MCG/2ML IJ SOLN
INTRAMUSCULAR | Status: AC | PRN
  Administered 2023-11-01: 50 ug via INTRAVENOUS

## 2023-11-01 MED ORDER — CIPROFLOXACIN HCL 500 MG PO TABS
500.0000 mg | ORAL_TABLET | Freq: Two times a day (BID) | ORAL | 0 refills | Status: AC
Start: 2023-11-01 — End: 2023-11-08

## 2023-11-01 MED ORDER — LEVOFLOXACIN IN D5W 500 MG/100ML IV SOLN
500.0000 mg | INTRAVENOUS | Status: DC
  Filled 2023-11-01: qty 100

## 2023-11-01 MED ORDER — ONDANSETRON HCL 4 MG/2ML IJ SOLN
INTRAMUSCULAR | Status: AC | PRN
  Administered 2023-11-01: 4 mg via INTRAVENOUS

## 2023-11-01 MED ORDER — IOHEXOL 300 MG/ML  SOLN
75.0000 mL | Freq: Once | INTRAMUSCULAR | Status: AC | PRN
  Administered 2023-11-01: 75 mL via INTRA_ARTERIAL

## 2023-11-01 MED ORDER — FENTANYL CITRATE (PF) 100 MCG/2ML IJ SOLN
INTRAMUSCULAR | Status: AC
Start: 2023-11-01 — End: 2023-11-01
  Filled 2023-11-01: qty 2

## 2023-11-01 MED ORDER — NITROGLYCERIN 1 MG/10 ML FOR IR/CATH LAB
INTRA_ARTERIAL | Status: AC | PRN
  Administered 2023-11-01 (×2): 100 ug via INTRA_ARTERIAL

## 2023-11-01 MED ORDER — LIDOCAINE HCL 1 % IJ SOLN
5.0000 mL | Freq: Once | INTRAMUSCULAR | Status: AC
  Administered 2023-11-01: 5 mL via INTRADERMAL

## 2023-11-01 NOTE — Progress Notes (Signed)
 Patient clinically stable post IR PAE per Dr Karalee, tolerated well. Dozed entire procedure, however at end of procedure awakened to voice nausea after which he vomited small amount of bile colored emesis. Given zofran  4 mg IV pre procedure and repeated at this time. Bp elevated initaially post procedure secondarily to emesis, however back to baseline. No bleeding nor hematoma at right groin site. Wife brought to bedside for Dr Karalee to speak to with update given. Update also given to wife post procedure at bedside by myself with questions answered. Report given to Joni Boyd Rn post procedure/specials/22.

## 2023-11-01 NOTE — Progress Notes (Signed)
 Patient remains stable clinically stable, discharge instructions given to wife/patient with questions answered. Bp stable, ready for discharge. No bleeding nor hematoma at right groin site.

## 2023-11-01 NOTE — Procedures (Signed)
 Interventional Radiology Procedure Note  Procedure: Successful PAE.   Complications: None  Estimated Blood Loss: None  Recommendations: - Bedrest x 1 hr - DC home   Signed,  Wilkie LOIS Lent, MD

## 2023-12-01 ENCOUNTER — Encounter: Payer: Self-pay | Admitting: Physician Assistant

## 2023-12-01 ENCOUNTER — Ambulatory Visit: Admitting: Physician Assistant

## 2023-12-01 VITALS — BP 168/66 | HR 71 | Ht 69.0 in | Wt 162.2 lb

## 2023-12-01 DIAGNOSIS — R338 Other retention of urine: Secondary | ICD-10-CM | POA: Diagnosis not present

## 2023-12-01 DIAGNOSIS — R112 Nausea with vomiting, unspecified: Secondary | ICD-10-CM | POA: Diagnosis not present

## 2023-12-01 DIAGNOSIS — N401 Enlarged prostate with lower urinary tract symptoms: Secondary | ICD-10-CM

## 2023-12-01 LAB — URINALYSIS, COMPLETE
Bilirubin, UA: NEGATIVE
Glucose, UA: NEGATIVE
Ketones, UA: NEGATIVE
Nitrite, UA: POSITIVE — AB
Protein,UA: NEGATIVE
RBC, UA: NEGATIVE
Specific Gravity, UA: 1.015 (ref 1.005–1.030)
Urobilinogen, Ur: 0.2 mg/dL (ref 0.2–1.0)
pH, UA: 6 (ref 5.0–7.5)

## 2023-12-01 LAB — MICROSCOPIC EXAMINATION: WBC, UA: >30 /HPF — AB (ref 0–5)

## 2023-12-01 MED ORDER — ONDANSETRON 4 MG PO TBDP: 4.0000 mg | ORAL_TABLET | Freq: Three times a day (TID) | ORAL | 0 refills | Status: AC | PRN

## 2023-12-01 MED ORDER — CIPROFLOXACIN HCL 500 MG PO TABS
500.0000 mg | ORAL_TABLET | Freq: Once | ORAL | Status: AC
  Administered 2023-12-01: 500 mg via ORAL

## 2023-12-01 NOTE — Progress Notes (Addendum)
 12/01/2023 1:06 PM   Adrian Aguilar 1932/11/09 [id number]  CC: Chief Complaint  Patient presents with   Benign prostatic hyperplasia with urinary retention   HPI: Adrian Aguilar is a 89 y.o. male with PMH BPH with urinary retention on Flomax  and finasteride  s/p PAE with Dr. Karalee on 11/01/2023 and dementia who presents today for voiding trial.  He is accompanied today by his wife and sister, who contributes to HPI.  Foley catheter removed in the morning, see separate note.  Cipro  500 mg x 1 dose administered prior to Foley removal.  He returned for PM PVR.  On arrival, he vomited multiple times.  Wife and sister reports sudden onset emesis in the car on the way today.  He denies pain and they deny fevers.  They report he has been pushing water all day and eaten very little and question if the Cipro  upset his stomach.  Unfortunately, we are unable to obtain triage vitals due to tremulousness and dementia, which precluded oral temp.  They declined axillary temp as an alternative.  Bladder scan on arrival >771mL.  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 2025   CKD (chronic kidney disease), stage III (HCC)    Dementia (HCC)    a.) on apoaequorin   Gout    Hypertension    PAD (peripheral artery disease) (HCC)    a.) s/p RIGHT femoral-popliteal bypass 04/18/2014   PONV (postoperative nausea and vomiting)    Retention of urine    Sepsis (HCC)    Status post bilateral cataract extraction     Surgical History: Past Surgical History:  Procedure Laterality Date   AAA s/p EVAR N/A 03/28/2014   CATARACT EXTRACTION W/ INTRAOCULAR LENS IMPLANT Bilateral    COARCTATION OF AORTA REPAIR     FEMORAL-POPLITEAL BYPASS GRAFT Right 04/18/2014   INTRAOPERATIVE CHOLANGIOGRAM   06/17/2023   Procedure: CHOLANGIOGRAM, INTRAOPERATIVE;  Surgeon: Adrian Laneta FALCON, MD;  Location: [facility] ORS;  Service: General;;   IR EMBO TUMOR ORGAN ISCHEMIA INFARCT INC GUIDE ROADMAPPING  11/01/2023   IR PERC CHOLECYSTOSTOMY  05/02/2023   IR RADIOLOGIST EVAL & MGMT  06/10/2023   IR RADIOLOGIST EVAL & MGMT  10/07/2023    Home Medications:  Allergies as of 12/01/2023       Reactions   Allopurinol Other (See Comments)   Lethargy   Meat Extract Itching, Rash, Other (See Comments)   Red meat - causes gout   Rosuvastatin Other (See Comments)   SEVERE LEG PAIN   Shellfish Allergy Itching, Nausea And Vomiting   According to wife.    Propofol  Itching, Dermatitis        Medication List        Accurate as of December 01, 2023  1:06 PM. If you have any questions, ask your nurse or doctor.          ascorbic acid  1000 MG tablet Commonly known as: VITAMIN C  Take 1,000 mg by mouth daily.   aspirin EC 81 MG tablet Take 81 mg by mouth daily. Swallow whole.   colchicine  0.6 MG tablet Take 0.5 tablets (0.3 mg total) by mouth daily. What changed: how much to take   febuxostat  40 MG tablet Commonly known as: ULORIC  Take 80 mg by mouth daily.   finasteride  5 MG  tablet Commonly known as: PROSCAR  Take 1 tablet (5 mg total) by mouth daily.   fluticasone 50 MCG/ACT nasal spray Commonly known as: FLONASE Place 2 sprays into both nostrils daily as needed for rhinitis or allergies.   lisinopril  20 MG tablet Commonly known as: ZESTRIL  Take 20 mg by mouth daily.   nystatin  powder Commonly known as: MYCOSTATIN /NYSTOP  Apply 1 Application topically 3 (three) times daily. What changed: when to take this   ondansetron  4 MG tablet Commonly known as: ZOFRAN  Take 4 mg by mouth.   polyethylene glycol 17 g packet Commonly known as: MiraLax  Take 17 g by mouth daily.   Praluent 75 MG/ML Soaj Generic drug: Alirocumab Inject 75 mg into the skin every 14 (fourteen) days.   senna-docusate  8.6-50 MG tablet Commonly known as: Senokot-S Take 2 tablets by mouth at bedtime.   tamsulosin  0.4 MG Caps capsule Commonly known as: FLOMAX  Take 1 capsule (0.4 mg total) by mouth daily.   traZODone  50 MG tablet Commonly known as: DESYREL  Take 50 mg by mouth.        Allergies:  Allergies  Allergen Reactions   Allopurinol Other (See Comments)    Lethargy   Meat Extract Itching, Rash and Other (See Comments)    Red meat - causes gout   Rosuvastatin Other (See Comments)    SEVERE LEG PAIN   Shellfish Allergy Itching and Nausea And Vomiting    According to wife.    Propofol  Itching and Dermatitis    Family History: Family History  Problem Relation Age of Onset   Diabetes Brother     Social History:   reports that he has never smoked. He has never been exposed to tobacco smoke. He has never used smokeless tobacco. He reports that he does not currently use alcohol. He reports that he does not use drugs.  Physical Exam: BP (!) 168/66 (BP Location: Left Arm, Patient Position: Sitting, Cuff Size: Normal)   Pulse 71   Ht 5' 9 (1.753 m)   Wt 162 lb 3.2 oz (73.6 kg)   BMI 23.95 kg/m   Constitutional:  Alert, vomiting, nontoxic appearing, skin warm and dry HEENT: [place], AT Cardiovascular: No clubbing, cyanosis, or edema Respiratory: Normal respiratory effort, no increased work of breathing Skin: No rashes, bruises or suspicious lesions Neurologic: Grossly intact, no focal deficits, moving all 4 extremities Psychiatric: Normal mood and affect  Laboratory Data: Results for orders placed or performed in visit on 12/01/23  Microscopic Examination   Collection Time: 12/01/23  9:10 AM   Urine  Result Value Ref Range   WBC, UA >30 (A) 0 - 5 /hpf   RBC, Urine 0-2 0 - 2 /hpf   Epithelial Cells (non renal) 0-10 0 - 10 /hpf   Bacteria, UA Many (A) None seen/Few  Urinalysis, Complete   Collection Time: 12/01/23  9:10 AM  Result Value Ref Range   Specific Gravity, UA 1.015  1.005 - 1.030   pH, UA 6.0 5.0 - 7.5   Color, UA Yellow Yellow   Appearance Ur Slightly cloudy Clear   Leukocytes,UA 2+ (A) Negative   Protein,UA Negative Negative/Trace   Glucose, UA Negative Negative   Ketones, UA Negative Negative   RBC, UA Negative Negative   Bilirubin, UA Negative Negative   Urobilinogen, Ur 0.2 0.2 - 1.0 mg/dL   Nitrite, UA Positive (A) Negative   Microscopic Examination See below:    Simple Catheter Placement  Due to urinary retention patient is present today for  a foley cath placement.  Patient was cleaned and prepped in a sterile fashion with betadine and 2% lidocaine  jelly was instilled into the urethra. A 16 FR coude foley catheter was inserted, urine return was noted  , urine was yellow in color.  The balloon was filled with 10cc of sterile water.  A leg bag was attached for drainage. Patient tolerated well, no complications were noted   Performed by: Irven Ingalsbe, PA-C   Assessment & Plan:   1. Benign prostatic hyperplasia with urinary retention (Primary) Voiding trial failed.  Foley catheter replaced as above.  Will repeat voiding trial in 2 weeks.  There were 2 urinalyses today: 1 from the morning off his old catheter, and 1 this afternoon from his new catheter.  His p.m. urinalysis was rather bland, low suspicion for UTI.  Anticipate Cipro  played a role in sterilizing the urine throughout the day.  Will send urine culture from p.m. urinalysis. - ciprofloxacin  (CIPRO ) tablet 500 mg - Urinalysis, Complete - BLADDER SCAN AMB NON-IMAGING - Urinalysis, Complete - CULTURE, URINE COMPREHENSIVE  2. Nausea and vomiting, unspecified vomiting type I was unable to fully assess the patient in clinic due to difficulty obtaining triage vitals.  I recommended evaluation in the emergency department, however his wife and sister declined this.  They understand the limitations of my evaluation today.  We discussed that if he spikes a fever or continues to  have vomiting, he should go to the emergency department.  I will prescribe him sublingual Zofran , as I think it is possible that the Cipro  was too harsh on his stomach today.  Notably, his vomiting lessened considerably throughout our visit today especially after Foley catheter replacement. - ondansetron  (ZOFRAN -ODT) 4 MG disintegrating tablet; Take 1 tablet (4 mg total) by mouth every 8 (eight) hours as needed for nausea or vomiting.  Dispense: 20 tablet; Refill: 0  Return in about 2 weeks (around 12/15/2023) for Voiding trial.  Lucie Hones, PA-C  [facility] [address] [place], [place] [address] [phone]

## 2023-12-01 NOTE — Progress Notes (Signed)
 Catheter Removal  Patient is present today for a catheter removal.  11ml of water was drained from the balloon. A 16FR foley cath was removed from the bladder, no complications were noted. Patient tolerated well.  Performed by: Myrtha Rubinstein, CMA.  Follow up/ Additional notes: Patient has an appointment to return to the clinic at 4:00.

## 2023-12-02 LAB — URINALYSIS, COMPLETE
Bilirubin, UA: NEGATIVE
Glucose, UA: NEGATIVE
Ketones, UA: NEGATIVE
Leukocytes,UA: NEGATIVE
Nitrite, UA: NEGATIVE
Protein,UA: NEGATIVE
RBC, UA: NEGATIVE
Specific Gravity, UA: 1.01 (ref 1.005–1.030)
Urobilinogen, Ur: 0.2 mg/dL (ref 0.2–1.0)
pH, UA: 6 (ref 5.0–7.5)

## 2023-12-02 LAB — MICROSCOPIC EXAMINATION

## 2023-12-09 LAB — CULTURE, URINE COMPREHENSIVE

## 2023-12-10 ENCOUNTER — Ambulatory Visit: Payer: Self-pay | Admitting: Physician Assistant

## 2023-12-10 ENCOUNTER — Other Ambulatory Visit: Payer: Self-pay

## 2023-12-10 DIAGNOSIS — R399 Unspecified symptoms and signs involving the genitourinary system: Secondary | ICD-10-CM

## 2023-12-10 MED ORDER — DOXYCYCLINE HYCLATE 100 MG PO CAPS: 100.0000 mg | ORAL_CAPSULE | Freq: Two times a day (BID) | ORAL | 0 refills | Status: DC

## 2023-12-15 ENCOUNTER — Ambulatory Visit: Admitting: Physician Assistant

## 2023-12-17 ENCOUNTER — Other Ambulatory Visit: Payer: Self-pay

## 2023-12-17 DIAGNOSIS — N138 Other obstructive and reflux uropathy: Secondary | ICD-10-CM

## 2023-12-17 DIAGNOSIS — R399 Unspecified symptoms and signs involving the genitourinary system: Secondary | ICD-10-CM

## 2023-12-17 DIAGNOSIS — N401 Enlarged prostate with lower urinary tract symptoms: Secondary | ICD-10-CM

## 2023-12-17 MED ORDER — DOXYCYCLINE HYCLATE 100 MG PO CAPS: 100.0000 mg | ORAL_CAPSULE | Freq: Two times a day (BID) | ORAL | 0 refills | Status: AC

## 2023-12-17 NOTE — Progress Notes (Signed)
 Surgical Physician Order Form [facility] [place]  Dr. Francisca * Scheduling expectation : Next Available  *Length of Case:   *Clearance needed: no  *Anticoagulation Instructions: N/A  *Aspirin Instructions: Hold Aspirin  *Post-op visit Date/Instructions:  1-3 day cath removal  *Diagnosis: BPH w/BOO  *Procedure: HOLEP (47350)   Additional orders: N/A  -Admit type: OUTpatient  -Anesthesia: General  -VTE Prophylaxis Standing Order SCD's       Other:   -Standing Lab Orders Per Anesthesia    Lab other: UA&Urine Culture  -Standing Test orders EKG/Chest x-ray per Anesthesia       Test other:   - Medications:  Ancef  2gm IV  -Other orders:  N/A

## 2023-12-17 NOTE — Addendum Note (Signed)
 Addended by: Lutricia Widjaja P on: 12/17/2023 12:05 PM   Modules accepted: Orders

## 2023-12-31 ENCOUNTER — Telehealth: Payer: Self-pay

## 2023-12-31 NOTE — Progress Notes (Signed)
  Phone Number: [phone] for Surgical Coordinator Fax Number: [phone]  REQUEST FOR SURGICAL CLEARANCE      Date: 12/31/2023  Faxed to: Dr. Isidore, MD  Surgeon: Dr. Redell Burnet, MD     Date of Surgery: 02/14/2024  Operation: Holmium Laser Enucleation of the Prostate   Anesthesia Type: General   Diagnosis: Benign Prostatic Hyperplasia with Urinary Obstruction  Patient Requires:   Cardiac / Vascular Clearance : Yes  Reason: Patient will need to hold 81mg  ASA prior to surgery x 5 days if able.      Risk Assessment:    Low   []       Moderate   []     High   []           This patient is optimized for surgery  YES []       NO   []    I recommend further assessment/workup prior to surgery. YES []      NO  []   Appointment scheduled for: _______________________   Further recommendations: ____________________________________     Physician Signature:__________________________________   Printed Name: ________________________________________   Date: _________________

## 2023-12-31 NOTE — Progress Notes (Signed)
   [facility] Surgical Posting Form  Surgery Date: Date: 02/14/2024  Surgeon: Dr. Redell Burnet, MD  Inpt ( No  )   Outpt (Yes)   Obs ( No  )   Diagnosis: N40.1, N13.8 Benign Prostatic Hyperplasia with Urinary Obstruction  -CPT: 47350  Surgery: Holmium Laser Enucleation of the Prostate  Stop Anticoagulations: Yes and also hold ASA  Cardiac/Medical/Pulmonary Clearance needed: Yes  Clearance needed from Dr: Isidore with [facility]  Clearance request sent on: Date: 12/31/23  *Orders entered into EPIC  Date: 12/31/23   *Case booked in EPIC  Date: 12/31/23  *Notified pt of Surgery: Date: 12/31/23  PRE-OP UA & CX: yes, will obtain in clinic on 02/03/24  *Placed into Prior Authorization Work [place] Date: 12/31/23  Assistant/laser/rep:No

## 2023-12-31 NOTE — Telephone Encounter (Signed)
 Per Dr. Francisca, Patient is to be scheduled for Holmium Laser Enucleation of the Prostate   [name] was contacted and possible surgical dates were discussed, Monday November 10th, 2025 was agreed upon for surgery. Patient was instructed that Dr. Francisca will require them to provide a pre-op UA & CX prior to surgery. This was ordered and scheduled drop off appointment was made for 02/02/2024.    Patient was directed to call [phone] between 1-3pm the day before surgery to find out surgical arrival time.  Instructions were given not to eat or drink from midnight on the night before surgery and have a driver for the day of surgery. On the surgery day patient was instructed to enter through the Medical Mall entrance of [facility] report the Same Day Surgery desk.   Pre-Admit Testing will be in contact via phone to set up an interview with the anesthesia team to review your history and medications prior to surgery.   Reminder of this information was sent via MyChart to the patient.

## 2024-01-05 ENCOUNTER — Ambulatory Visit: Admitting: Physician Assistant

## 2024-01-05 VITALS — BP 149/70 | HR 69 | Wt 159.2 lb

## 2024-01-05 DIAGNOSIS — N138 Other obstructive and reflux uropathy: Secondary | ICD-10-CM

## 2024-01-05 DIAGNOSIS — N401 Enlarged prostate with lower urinary tract symptoms: Secondary | ICD-10-CM

## 2024-01-05 NOTE — Progress Notes (Signed)
 Cath Change/ Replacement  Patient is present today for a catheter change due to urinary retention.  8ml of water was removed from the balloon, a 16FR coude foley cath was removed without difficulty.  Patient was cleaned and prepped in a sterile fashion with betadine and 2% lidocaine  jelly was instilled into the urethra. A 16 FR coude foley cath was replaced into the bladder, no complications were noted. Urine return was noted 5ml and urine was yellow in color. The balloon was filled with 10ml of sterile water. A leg bag was attached for drainage.  Patient tolerated well.    Performed by: Lucie Hones, PA-C   Follow up: Return in about 4 weeks (around 02/02/2024) for Discuss HOLEP, cath change, preop UA/CX.

## 2024-01-13 ENCOUNTER — Telehealth: Payer: Self-pay

## 2024-01-13 NOTE — Telephone Encounter (Signed)
 Spoke with pt. Wife. Patient advised to hold 81mg  ASA for 5 days prior to surgery. Received clearance from [facility].

## 2024-02-02 ENCOUNTER — Other Ambulatory Visit: Payer: Self-pay

## 2024-02-02 ENCOUNTER — Ambulatory Visit: Admitting: Urology

## 2024-02-02 VITALS — BP 155/74

## 2024-02-02 DIAGNOSIS — N401 Enlarged prostate with lower urinary tract symptoms: Secondary | ICD-10-CM | POA: Diagnosis not present

## 2024-02-02 DIAGNOSIS — N138 Other obstructive and reflux uropathy: Secondary | ICD-10-CM

## 2024-02-02 DIAGNOSIS — N390 Urinary tract infection, site not specified: Secondary | ICD-10-CM

## 2024-02-02 LAB — MICROSCOPIC EXAMINATION: WBC, UA: >30 /HPF — AB (ref 0–5)

## 2024-02-02 LAB — URINALYSIS, COMPLETE
Bilirubin, UA: NEGATIVE
Glucose, UA: NEGATIVE
Ketones, UA: NEGATIVE
Nitrite, UA: POSITIVE — AB
Protein,UA: NEGATIVE
RBC, UA: NEGATIVE
Specific Gravity, UA: 1.02 (ref 1.005–1.030)
Urobilinogen, Ur: 0.2 mg/dL (ref 0.2–1.0)
pH, UA: 6 (ref 5.0–7.5)

## 2024-02-02 MED ORDER — LIDOCAINE HCL URETHRAL/MUCOSAL 2 % EX GEL
1.0000 | Freq: Once | CUTANEOUS | Status: AC
  Administered 2024-02-02: 1 via URETHRAL

## 2024-02-02 NOTE — Patient Instructions (Signed)

## 2024-02-02 NOTE — Progress Notes (Signed)
 Simple Catheter Placement  Due to urinary retention patient is present today for a foley cath placement.  Patient was cleaned and prepped in a sterile fashion with betadine and 2% lidocaine  jelly was instilled into the urethra. A 16 FR Coude foley catheter was inserted, urine return was noted  20ml, urine was yellow in color.  The balloon was filled with 10cc of sterile water.  A Night bag was attached for drainage. Patient was also given a extra leg bag to take home and was given instruction on how to change from one bag to another.  Patient was given instruction on proper catheter care.  Patient tolerated well, no complications were noted   Performed by: Beauford Browner, CCMA  Additional notes/ Follow up: Surgery scheduled 02/14/2024 follow up after surgery scheduled 02/16/2024

## 2024-02-02 NOTE — Progress Notes (Signed)
   02/02/2024 8:19 AM   Adrian Aguilar Aug 05, 1932 [id number]  Reason for visit: Follow up urinary retention, discuss HOLEP, recurrent UTI  History: Previously followed by Dr. Penne and Sheppard Hones and Shannon Mcgowan, PA's Foley dependent urinary retention, ultimately underwent prostate artery embolization in July 2025 Failed multiple voiding trials despite maximal medical therapy and PAE Severe dementia, has not tolerated catheter well  Physical Exam: BP (!) 155/74 (BP Location: Left Arm, Patient Position: Sitting, Cuff Size: Normal)    Imaging/labs: I personally viewed and interpreted the CT scan from February 2025 and prostate measured 56 g at that time with Foley catheter in place and thickened bladder wall.  Underwent prostate artery embolization in July 2025 and I also reviewed those images. Prior urine culture data reviewed  Today: History obtained entirely from his wife today, patient has fairly severe dementia They would like to move forward with HOLEP, he is not tolerating the catheter well  Plan:   BPH and retention: We reviewed including chronic Foley, intermittent catheterization, repeated voiding trials, or more definitive treatment with HOLEP. We discussed the risks and benefits of HoLEP at length.  The procedure requires general anesthesia and takes 1 to 2 hours, and a holmium laser is used to enucleate the prostate and push this tissue into the bladder.  A morcellator is then used to remove this tissue, which is sent for pathology.  The vast majority(>95%) of patients are able to discharge the same day with a catheter in place for 2 to 3 days, and will follow-up in clinic for a voiding trial.  We specifically discussed the risks of bleeding, infection, retrograde ejaculation, temporary urgency and urge incontinence, very low risk of long-term incontinence, urethral stricture/bladder neck contracture, pathologic evaluation of prostate tissue and possible detection of  prostate cancer or other malignancy, and possible need for additional procedures.  Would estimate greater than 90% chance of resuming spontaneous voiding even if atonic bladder, less than 5% chance of long-term incontinence after 6 months.  Foley exchanged today by nursing, see note, preop urine culture sent. Scheduled for HOLEP 02/14/2024.  Prostate measured 55 g prior to PAE   Adrian JAYSON Burnet, MD  [facility] [address], [address] [place], [place] [address] [phone]

## 2024-02-02 NOTE — Addendum Note (Signed)
 Addended by: CLYDELL GLATTER A on: 02/02/2024 09:05 AM   Modules accepted: Orders

## 2024-02-06 LAB — CULTURE, URINE COMPREHENSIVE

## 2024-02-07 ENCOUNTER — Other Ambulatory Visit: Payer: Self-pay

## 2024-02-07 ENCOUNTER — Telehealth: Payer: Self-pay

## 2024-02-07 ENCOUNTER — Encounter
Admission: RE | Admit: 2024-02-07 | Discharge: 2024-02-07 | Disposition: A | Discharge status: Home / Self Care | Source: Ambulatory Visit | Attending: Urology | Admitting: Urology

## 2024-02-07 DIAGNOSIS — I10 Essential (primary) hypertension: Secondary | ICD-10-CM

## 2024-02-07 DIAGNOSIS — Z01812 Encounter for preprocedural laboratory examination: Secondary | ICD-10-CM

## 2024-02-07 DIAGNOSIS — I714 Abdominal aortic aneurysm, without rupture, unspecified: Secondary | ICD-10-CM

## 2024-02-07 DIAGNOSIS — N401 Enlarged prostate with lower urinary tract symptoms: Secondary | ICD-10-CM

## 2024-02-07 DIAGNOSIS — Z01810 Encounter for preprocedural cardiovascular examination: Secondary | ICD-10-CM

## 2024-02-07 DIAGNOSIS — N1832 Chronic kidney disease, stage 3b: Secondary | ICD-10-CM

## 2024-02-07 HISTORY — DX: Hyperlipidemia, unspecified: E78.5

## 2024-02-07 HISTORY — DX: Unspecified hearing loss, bilateral: H91.93

## 2024-02-07 HISTORY — DX: Diverticulitis of intestine, part unspecified, without perforation or abscess without bleeding: K57.92

## 2024-02-07 HISTORY — DX: Other complications of anesthesia, initial encounter: T88.59XA

## 2024-02-07 MED ORDER — CEPHALEXIN 500 MG PO CAPS: 500.0000 mg | ORAL_CAPSULE | Freq: Two times a day (BID) | ORAL | 0 refills | Status: AC

## 2024-02-07 NOTE — Telephone Encounter (Signed)
 Spoke with pt. Wife. Will send antibiotics for patient to start prior to surgery. Sent to [facility]. Patient wife verbalized understanding.

## 2024-02-07 NOTE — Patient Instructions (Signed)
 Your procedure is scheduled on:02-14-24 Monday Report to the Registration Desk on the 1st floor of the Medical Mall.Then proceed to the 2nd floor Surgery Desk To find out your arrival time, please call [phone] between 1PM - 3PM on:02-11-24 Friday If your arrival time is 6:00 am, do not arrive before that time as the Medical Mall entrance doors do not open until 6:00 am.  REMEMBER: Instructions that are not followed completely may result in serious medical risk, up to and including death; or upon the discretion of your surgeon and anesthesiologist your surgery may need to be rescheduled.  Do not eat food OR drink liquids after midnight the night before surgery.  No gum chewing or hard candies.  One week prior to surgery:Stop NOW (02-07-24) Stop Anti-inflammatories (NSAIDS) such as Advil, Aleve, Ibuprofen, Motrin, Naproxen, Naprosyn and Aspirin based products such as Excedrin, Goody's Powder, BC Powder. Stop ANY OVER THE COUNTER supplements until after surgery (Probiotic, Prevagen, Vitamin C , Vitamin D, Vitamin B12)  You may however, continue to take Tylenol  if needed for pain up until the day of surgery.  Stop your 81 mg Aspirin 5 days prior to surgery-Last dose will be on 02-08-24 Tuesday  Continue taking all of your other prescription medications up until the day of surgery.  ON THE DAY OF SURGERY ONLY TAKE THESE MEDICATIONS WITH SIPS OF WATER: -finasteride  (PROSCAR )   No Alcohol for 24 hours before or after surgery.  No Smoking including e-cigarettes for 24 hours before surgery.  No chewable tobacco products for at least 6 hours before surgery.  No nicotine patches on the day of surgery.  Do not use any recreational drugs for at least a week (preferably 2 weeks) before your surgery.  Please be advised that the combination of cocaine and anesthesia may have negative outcomes, up to and including death. If you test positive for cocaine, your surgery will be cancelled.  On the  morning of surgery brush your teeth with toothpaste and water, you may rinse your mouth with mouthwash if you wish. Do not swallow any toothpaste or mouthwash.  Do not wear jewelry, make-up, hairpins, clips or nail polish.  For welded (permanent) jewelry: bracelets, anklets, waist bands, etc.  Please have this removed prior to surgery.  If it is not removed, there is a chance that hospital personnel will need to cut it off on the day of surgery.  Do not wear lotions, powders, or perfumes.   Do not shave body hair from the neck down 48 hours before surgery.  Contact lenses, hearing aids and dentures may not be worn into surgery.  Do not bring valuables to the hospital. [facility] is not responsible for any missing/lost belongings or valuables.   Notify your doctor if there is any change in your medical condition (cold, fever, infection).  Wear comfortable clothing (specific to your surgery type) to the hospital.  After surgery, you can help prevent lung complications by doing breathing exercises.  Take deep breaths and cough every 1-2 hours. Your doctor may order a device called an Incentive Spirometer to help you take deep breaths. When coughing or sneezing, hold a pillow firmly against your incision with both hands. This is called "splinting." Doing this helps protect your incision. It also decreases belly discomfort.  If you are being admitted to the hospital overnight, leave your suitcase in the car. After surgery it may be brought to your room.  In case of increased patient census, it may be necessary for you,  the patient, to continue your postoperative care in the Same Day Surgery department.  If you are being discharged the day of surgery, you will not be allowed to drive home. You will need a responsible individual to drive you home and stay with you for 24 hours after surgery.   If you are taking public transportation, you will need to have a responsible individual with  you.  Please call the Pre-admissions Testing Dept. at [phone] if you have any questions about these instructions.  Surgery Visitation Policy:  Patients having surgery or a procedure may have two visitors.  Children under the age of 50 must have an adult with them who is not the patient.  Merchandiser, Retail to address health-related social needs:  [email or web address]

## 2024-02-08 ENCOUNTER — Encounter
Admission: RE | Admit: 2024-02-08 | Discharge: 2024-02-08 | Disposition: A | Discharge status: Home / Self Care | Source: Ambulatory Visit | Attending: Urology | Admitting: Urology

## 2024-02-08 DIAGNOSIS — Z01818 Encounter for other preprocedural examination: Secondary | ICD-10-CM | POA: Diagnosis present

## 2024-02-08 DIAGNOSIS — I129 Hypertensive chronic kidney disease with stage 1 through stage 4 chronic kidney disease, or unspecified chronic kidney disease: Secondary | ICD-10-CM | POA: Insufficient documentation

## 2024-02-08 DIAGNOSIS — Z01810 Encounter for preprocedural cardiovascular examination: Secondary | ICD-10-CM

## 2024-02-08 DIAGNOSIS — N1832 Chronic kidney disease, stage 3b: Secondary | ICD-10-CM | POA: Diagnosis not present

## 2024-02-08 DIAGNOSIS — I714 Abdominal aortic aneurysm, without rupture, unspecified: Secondary | ICD-10-CM | POA: Insufficient documentation

## 2024-02-08 DIAGNOSIS — I10 Essential (primary) hypertension: Secondary | ICD-10-CM

## 2024-02-08 DIAGNOSIS — Z01812 Encounter for preprocedural laboratory examination: Secondary | ICD-10-CM

## 2024-02-08 LAB — BASIC METABOLIC PANEL WITH GFR
Anion gap: 10 (ref 5–15)
BUN: 38 mg/dL — ABNORMAL HIGH (ref 8–23)
CO2: 21 mmol/L — ABNORMAL LOW (ref 22–32)
Calcium: 9.1 mg/dL (ref 8.9–10.3)
Chloride: 105 mmol/L (ref 98–111)
Creatinine, Ser: 1.96 mg/dL — ABNORMAL HIGH (ref 0.61–1.24)
GFR, Estimated: 32 mL/min — ABNORMAL LOW (ref >60)
Glucose, Bld: 94 mg/dL (ref 70–99)
Potassium: 3.9 mmol/L (ref 3.5–5.1)
Sodium: 136 mmol/L (ref 135–145)

## 2024-02-08 LAB — CBC
HCT: 32.1 % — ABNORMAL LOW (ref 39.0–52.0)
Hemoglobin: 11 g/dL — ABNORMAL LOW (ref 13.0–17.0)
MCH: 30.1 pg (ref 26.0–34.0)
MCHC: 34.3 g/dL (ref 30.0–36.0)
MCV: 87.7 fL (ref 80.0–100.0)
Platelets: 362 K/uL (ref 150–400)
RBC: 3.66 MIL/uL — ABNORMAL LOW (ref 4.22–5.81)
RDW: 12.5 % (ref 11.5–15.5)
WBC: 10.2 K/uL (ref 4.0–10.5)
nRBC: 0 % (ref 0.0–0.2)

## 2024-02-13 MED ORDER — CHLORHEXIDINE GLUCONATE 0.12 % MT SOLN
15.0000 mL | Freq: Once | OROMUCOSAL | Status: AC
  Administered 2024-02-14: 15 mL via OROMUCOSAL

## 2024-02-13 MED ORDER — LACTATED RINGERS IV SOLN: INTRAVENOUS | Status: DC

## 2024-02-13 MED ORDER — ORAL CARE MOUTH RINSE: 15.0000 mL | Freq: Once | OROMUCOSAL | Status: AC

## 2024-02-13 MED ORDER — CEFAZOLIN SODIUM-DEXTROSE 2-4 GM/100ML-% IV SOLN
2.0000 g | INTRAVENOUS | Status: AC
  Administered 2024-02-14: 2 g via INTRAVENOUS

## 2024-02-14 ENCOUNTER — Encounter
Admission: RE | Disposition: A | Discharge status: Home / Self Care | Payer: Self-pay | Source: Home / Self Care | Attending: Urology

## 2024-02-14 ENCOUNTER — Ambulatory Visit
Admission: RE | Admit: 2024-02-14 | Discharge: 2024-02-14 | Disposition: A | Discharge status: Home / Self Care | Attending: Urology | Admitting: Urology

## 2024-02-14 ENCOUNTER — Ambulatory Visit: Payer: Self-pay

## 2024-02-14 ENCOUNTER — Other Ambulatory Visit: Payer: Self-pay

## 2024-02-14 ENCOUNTER — Ambulatory Visit: Payer: Self-pay | Admitting: Urgent Care

## 2024-02-14 ENCOUNTER — Encounter: Payer: Self-pay | Admitting: Urology

## 2024-02-14 DIAGNOSIS — I129 Hypertensive chronic kidney disease with stage 1 through stage 4 chronic kidney disease, or unspecified chronic kidney disease: Secondary | ICD-10-CM | POA: Insufficient documentation

## 2024-02-14 DIAGNOSIS — N401 Enlarged prostate with lower urinary tract symptoms: Secondary | ICD-10-CM | POA: Diagnosis not present

## 2024-02-14 DIAGNOSIS — R338 Other retention of urine: Secondary | ICD-10-CM | POA: Insufficient documentation

## 2024-02-14 DIAGNOSIS — F0390 Unspecified dementia without behavioral disturbance: Secondary | ICD-10-CM | POA: Insufficient documentation

## 2024-02-14 DIAGNOSIS — N138 Other obstructive and reflux uropathy: Secondary | ICD-10-CM | POA: Diagnosis present

## 2024-02-14 DIAGNOSIS — N183 Chronic kidney disease, stage 3 unspecified: Secondary | ICD-10-CM | POA: Diagnosis not present

## 2024-02-14 HISTORY — PX: HOLEP-LASER ENUCLEATION OF THE PROSTATE WITH MORCELLATION: SHX6641

## 2024-02-14 SURGERY — ENUCLEATION, PROSTATE, USING LASER, WITH MORCELLATION
Anesthesia: General | Site: Prostate

## 2024-02-14 MED ORDER — STERILE WATER FOR IRRIGATION IR SOLN
Status: DC | PRN
  Administered 2024-02-14: 1000 mL

## 2024-02-14 MED ORDER — ACETAMINOPHEN 10 MG/ML IV SOLN
INTRAVENOUS | Status: AC
  Filled 2024-02-14: qty 100

## 2024-02-14 MED ORDER — ROCURONIUM BROMIDE 100 MG/10ML IV SOLN
INTRAVENOUS | Status: DC | PRN
  Administered 2024-02-14: 30 mg via INTRAVENOUS
  Administered 2024-02-14: 10 mg via INTRAVENOUS

## 2024-02-14 MED ORDER — GLYCOPYRROLATE 0.2 MG/ML IJ SOLN
INTRAMUSCULAR | Status: DC | PRN
  Administered 2024-02-14: .2 mg via INTRAVENOUS

## 2024-02-14 MED ORDER — SUGAMMADEX SODIUM 200 MG/2ML IV SOLN
INTRAVENOUS | Status: DC | PRN
  Administered 2024-02-14: 200 mg via INTRAVENOUS

## 2024-02-14 MED ORDER — LIDOCAINE HCL (CARDIAC) PF 100 MG/5ML IV SOSY
PREFILLED_SYRINGE | INTRAVENOUS | Status: DC | PRN
  Administered 2024-02-14: 100 mg via INTRAVENOUS

## 2024-02-14 MED ORDER — ACETAMINOPHEN 10 MG/ML IV SOLN
INTRAVENOUS | Status: DC | PRN
  Administered 2024-02-14: 1000 mg via INTRAVENOUS

## 2024-02-14 MED ORDER — OXYCODONE HCL 5 MG PO TABS: 5.0000 mg | ORAL_TABLET | Freq: Once | ORAL | Status: DC | PRN

## 2024-02-14 MED ORDER — CHLORHEXIDINE GLUCONATE 0.12 % MT SOLN
OROMUCOSAL | Status: AC
  Filled 2024-02-14: qty 15

## 2024-02-14 MED ORDER — PROPOFOL 10 MG/ML IV BOLUS
INTRAVENOUS | Status: AC
Start: 2024-02-14 — End: 2024-02-14
  Filled 2024-02-14: qty 20

## 2024-02-14 MED ORDER — SUCCINYLCHOLINE CHLORIDE 200 MG/10ML IV SOSY
PREFILLED_SYRINGE | INTRAVENOUS | Status: DC | PRN
  Administered 2024-02-14: 100 mg via INTRAVENOUS

## 2024-02-14 MED ORDER — SODIUM CHLORIDE 0.9 % IR SOLN
Status: DC | PRN
  Administered 2024-02-14: 15000 mL

## 2024-02-14 MED ORDER — ONDANSETRON HCL 4 MG/2ML IJ SOLN
INTRAMUSCULAR | Status: DC | PRN
  Administered 2024-02-14 (×2): 4 mg via INTRAVENOUS

## 2024-02-14 MED ORDER — PROPOFOL 10 MG/ML IV BOLUS
INTRAVENOUS | Status: DC | PRN
  Administered 2024-02-14: 100 mg via INTRAVENOUS

## 2024-02-14 MED ORDER — FENTANYL CITRATE (PF) 100 MCG/2ML IJ SOLN
INTRAMUSCULAR | Status: AC
  Filled 2024-02-14: qty 2

## 2024-02-14 MED ORDER — DEXAMETHASONE SOD PHOSPHATE PF 10 MG/ML IJ SOLN
INTRAMUSCULAR | Status: DC | PRN
  Administered 2024-02-14: 10 mg via INTRAVENOUS

## 2024-02-14 MED ORDER — FENTANYL CITRATE (PF) 100 MCG/2ML IJ SOLN
INTRAMUSCULAR | Status: DC | PRN
  Administered 2024-02-14: 50 ug via INTRAVENOUS

## 2024-02-14 MED ORDER — TRAMADOL HCL 50 MG PO TABS: 25.0000 mg | ORAL_TABLET | Freq: Four times a day (QID) | ORAL | 0 refills | Status: AC | PRN

## 2024-02-14 MED ORDER — CEFAZOLIN SODIUM-DEXTROSE 2-4 GM/100ML-% IV SOLN
INTRAVENOUS | Status: AC
  Filled 2024-02-14: qty 100

## 2024-02-14 MED ORDER — FENTANYL CITRATE (PF) 100 MCG/2ML IJ SOLN: 25.0000 ug | INTRAMUSCULAR | Status: DC | PRN

## 2024-02-14 MED ORDER — OXYCODONE HCL 5 MG/5ML PO SOLN: 5.0000 mg | Freq: Once | ORAL | Status: DC | PRN

## 2024-02-14 SURGICAL SUPPLY — 24 items
ADAPTER IRRIG TUBE 2 SPIKE SOL (ADAPTER) ×2 IMPLANT
BAG URO DRAIN 4000ML (MISCELLANEOUS) ×1 IMPLANT
CATH URETL OPEN END 4X70 (CATHETERS) ×1 IMPLANT
CATH URTH STD 24FR FL 3W 2 (CATHETERS) ×1 IMPLANT
CONTAINER COLLECT MORCELLATR (MISCELLANEOUS) ×1 IMPLANT
DRAPE UTILITY 15X26 TOWEL STRL (DRAPES) IMPLANT
FIBER LASER MOSES 550 DFL (Laser) ×1 IMPLANT
FILTER OVERFLOW MORCELLATOR (FILTER) ×1 IMPLANT
GLOVE BIOGEL PI IND STRL 7.5 (GLOVE) ×1 IMPLANT
GOWN STRL REUS W/ TWL LRG LVL3 (GOWN DISPOSABLE) ×1 IMPLANT
GOWN STRL REUS W/ TWL XL LVL3 (GOWN DISPOSABLE) ×1 IMPLANT
HOLDER FOLEY CATH W/STRAP (MISCELLANEOUS) ×1 IMPLANT
KIT TURNOVER CYSTO (KITS) ×1 IMPLANT
MEMBRANE SLNG YLW 17 FOR INST (MISCELLANEOUS) ×1 IMPLANT
MORCELLATOR ROTATION 4.75 335 (MISCELLANEOUS) ×1 IMPLANT
PACK CYSTO AR (MISCELLANEOUS) ×1 IMPLANT
SET CYSTO IRRIGATION (SET/KITS/TRAYS/PACK) ×1 IMPLANT
SET IRRIG Y TYPE TUR BLADDER L (SET/KITS/TRAYS/PACK) ×1 IMPLANT
SLEEVE PROTECTION STRL DISP (MISCELLANEOUS) ×2 IMPLANT
SOL .9 NS 3000ML IRR UROMATIC (IV SOLUTION) ×5 IMPLANT
SOLN STERILE WATER BTL 1000 ML (IV SOLUTION) ×1 IMPLANT
SURGILUBE 2OZ TUBE FLIPTOP (MISCELLANEOUS) ×1 IMPLANT
SYRINGE TOOMEY IRRIG 70ML (MISCELLANEOUS) ×1 IMPLANT
TUBE PUMP MORCELLATOR PIRANHA (TUBING) ×1 IMPLANT

## 2024-02-14 NOTE — Anesthesia Preprocedure Evaluation (Signed)
 Anesthesia Evaluation  Patient identified by MRN, date of birth, ID band Patient awake    Reviewed: Allergy & Precautions, NPO status , Patient's Chart, lab work & pertinent test results  History of Anesthesia Complications (+) PONV and history of anesthetic complications  Airway Mallampati: III  TM Distance: >3 FB Neck ROM: full    Dental  (+) Chipped, Poor Dentition   Pulmonary neg pulmonary ROS, neg shortness of breath   Pulmonary exam normal        Cardiovascular Exercise Tolerance: Good hypertension, (-) angina + Peripheral Vascular Disease  (-) Past MI Normal cardiovascular exam     Neuro/Psych       Dementia negative neurological ROS     GI/Hepatic negative GI ROS, Neg liver ROS,,,  Endo/Other  negative endocrine ROS    Renal/GU Renal disease     Musculoskeletal   Abdominal   Peds  Hematology negative hematology ROS (+)   Anesthesia Other Findings Past Medical History: No date: AAA (abdominal aortic aneurysm)     Comment:  a.) s/p EVAR 03/28/2014 No date: Aortic atherosclerosis No date: Bilateral hearing loss No date: BPH (benign prostatic hyperplasia) 2025: Chronic cholecystitis due to gallbladder calculus with  obstruction No date: CKD (chronic kidney disease), stage III (HCC) No date: Complication of anesthesia No date: Dementia (HCC)     Comment:  a.) on apoaequorin No date: Diverticulitis No date: Gout No date: Hyperlipidemia No date: Hypertension No date: PAD (peripheral artery disease)     Comment:  a.) s/p RIGHT femoral-popliteal bypass 04/18/2014 No date: PONV (postoperative nausea and vomiting) No date: Retention of urine No date: Sepsis (HCC) No date: Status post bilateral cataract extraction  Past Surgical History: 03/28/2014: AAA s/p EVAR; N/A No date: CATARACT EXTRACTION W/ INTRAOCULAR LENS IMPLANT; Bilateral No date: COARCTATION OF AORTA REPAIR 04/18/2014: FEMORAL-POPLITEAL  BYPASS GRAFT; Right 06/17/2023: INTRAOPERATIVE CHOLANGIOGRAM     Comment:  Procedure: CHOLANGIOGRAM, INTRAOPERATIVE;  Surgeon:               Jordis Laneta FALCON, MD;  Location: [facility] ORS;  Service:               General;; 11/01/2023: IR EMBO TUMOR ORGAN ISCHEMIA INFARCT INC GUIDE ROADMAPPING 05/02/2023: IR PERC CHOLECYSTOSTOMY 06/10/2023: IR RADIOLOGIST EVAL & MGMT 10/07/2023: IR RADIOLOGIST EVAL & MGMT     Reproductive/Obstetrics negative OB ROS                              Anesthesia Physical Anesthesia Plan  ASA: 2  Anesthesia Plan: General ETT   Post-op Pain Management:    Induction: Intravenous  PONV Risk Score and Plan: Ondansetron , Dexamethasone , Midazolam  and Treatment may vary due to age or medical condition  Airway Management Planned: Oral ETT  Additional Equipment:   Intra-op Plan:   Post-operative Plan: Extubation in OR  Informed Consent: I have reviewed the patients History and Physical, chart, labs and discussed the procedure including the risks, benefits and alternatives for the proposed anesthesia with the patient or authorized representative who has indicated his/her understanding and acceptance.     Dental Advisory Given  Plan Discussed with: Anesthesiologist, CRNA and Surgeon  Anesthesia Plan Comments: (Patient and wife consented for risks of anesthesia including but not limited to:  - adverse reactions to medications - damage to eyes, teeth, lips or other oral mucosa - nerve damage due to positioning  - sore throat or hoarseness - Damage to heart,  brain, nerves, lungs, other parts of body or loss of life  They voiced understanding and assent.)        Anesthesia Quick Evaluation

## 2024-02-14 NOTE — Anesthesia Procedure Notes (Signed)
 Procedure Name: Intubation Date/Time: 02/14/2024 8:48 AM  Performed by: Tod Handing, CRNAPre-anesthesia Checklist: Patient identified, Patient being monitored, Timeout performed, Emergency Drugs available and Suction available Patient Re-evaluated:Patient Re-evaluated prior to induction Oxygen Delivery Method: Circle system utilized Preoxygenation: Pre-oxygenation with 100% oxygen Induction Type: IV induction Ventilation: Mask ventilation without difficulty Laryngoscope Size: Mac, 3 and McGrath Grade View: Grade I Tube type: Oral Tube size: 7.0 mm Number of attempts: 1 Airway Equipment and Method: Stylet Placement Confirmation: ETT inserted through vocal cords under direct vision, positive ETCO2 and breath sounds checked- equal and bilateral Secured at: 23 cm Tube secured with: Tape Dental Injury: Teeth and Oropharynx as per pre-operative assessment

## 2024-02-14 NOTE — Transfer of Care (Signed)
 Immediate Anesthesia Transfer of Care Note  Patient: Adrian Aguilar  Procedure(s) Performed: ENUCLEATION, PROSTATE, USING LASER, WITH MORCELLATION (Prostate)  Patient Location: PACU  Anesthesia Type:General  Level of Consciousness: drowsy and patient cooperative  Airway & Oxygen Therapy: Patient Spontanous Breathing and Patient connected to face mask oxygen  Post-op Assessment: Report given to RN and Post -op Vital signs reviewed and stable  Post vital signs: Reviewed and stable  Last Vitals:  Vitals Value Taken Time  BP    Temp    Pulse 66 02/14/24 09:37  Resp 13 02/14/24 09:37  SpO2 100 % 02/14/24 09:37  Vitals shown include unfiled device data.  Last Pain:  Vitals:   02/14/24 0751  TempSrc: Temporal  PainSc: 0-No pain         Complications: No notable events documented.

## 2024-02-14 NOTE — Op Note (Signed)
 Date of procedure: 02/14/24  Preoperative diagnosis:  BPH with retention   Postoperative diagnosis:  Same  Procedure: HoLEP (Holmium Laser Enucleation of the Prostate)  Surgeon: Redell Burnet, MD  Anesthesia: General  Complications: None  Intraoperative findings:  Small but tight prostate, severe bladder trabeculations, no suspicious lesions Uncomplicated HOLEP, good hemostasis, ureteral orifices and verumontanum intact at conclusion of case  EBL: Minimal  Specimens: Prostate chips  Enucleation time: 13 minutes  Morcellation time: 5 minutes  Intra-op weight: 20g  Drains: 24 French three-way, 60 cc in balloon  Indication: Juergen Hardenbrook is a 89 y.o. patient with BPH and Foley dependent urinary retention, previously failed prostate artery embolization and opted for HOLEP to try to resume spontaneous voiding.  After reviewing the management options for treatment, they elected to proceed with the above surgical procedure(s). We have discussed the potential benefits and risks of the procedure, side effects of the proposed treatment, the likelihood of the patient achieving the goals of the procedure, and any potential problems that might occur during the procedure or recuperation.  We specifically discussed the risks of bleeding, infection, hematuria and clot retention, need for additional procedures, possible overnight hospital stay, temporary urgency and incontinence, rare long-term incontinence, and retrograde ejaculation.  Informed consent has been obtained.   Description of procedure:  The patient was taken to the operating room and general anesthesia was induced.  The patient was placed in the dorsal lithotomy position, prepped and draped in the usual sterile fashion, and preoperative antibiotics were administered.  SCDs were placed for DVT prophylaxis.  A preoperative time-out was performed.   Fleeta Needs sounds were used to gently dilated the urethra up to 64F. The 22 French  continuous flow resectoscope was inserted into the urethra using the visual obturator  The prostate was small with obstructive lateral lobes. The bladder was thoroughly inspected and notable for severe bladder trabeculations but no suspicious lesions.  The ureteral orifices were located in orthotopic position.    The laser was set to 2 J and 60 Hz and early apical release was performed by making a circumferential mucosal incision proximal to the sphincter.  A lambda incision was then made proximal to the verumontanum.  The prostate was enucleated en bloc circumferentially into the bladder.  The capsule was examined and laser was used for meticulous hemostasis.    The 62 French resectoscope was then switched out for the 26 French nephroscope and prostate tissue was morcellated(Piranha) and the tissue sent to pathology.  A 24 French three-way catheter was inserted easily with the aid of a catheter guide, and 60 cc were placed in the balloon.  Urine was pink.  The catheter irrigated easily with a Toomey syringe.  CBI was initiated.   The patient tolerated the procedure well without any immediate complications and was extubated and transferred to the recovery room in stable condition.  Urine was clear on fast CBI.  Disposition: Stable to PACU  Plan: Wean CBI in PACU, anticipate discharge home today with Foley removal in clinic in 2-3 days  Redell Burnet, MD 02/14/2024

## 2024-02-14 NOTE — H&P (Signed)
   02/14/24 8:37 AM   Adrian Aguilar 25-Jul-1932 [id number]  CC: BPH with retention  HPI: Comorbid 89 year old male with dementia, Foley dependent urinary retention, previously underwent prostate artery embolization with persistent retention.  Here today for HOLEP.   PMH: Past Medical History:  Diagnosis Date   AAA (abdominal aortic aneurysm)    a.) s/p EVAR 03/28/2014   Aortic atherosclerosis    Bilateral hearing loss    BPH (benign prostatic hyperplasia)    Chronic cholecystitis due to gallbladder calculus with obstruction 2025   CKD (chronic kidney disease), stage III (HCC)    Complication of anesthesia    Dementia (HCC)    a.) on apoaequorin   Diverticulitis    Gout    Hyperlipidemia    Hypertension    PAD (peripheral artery disease)    a.) s/p RIGHT femoral-popliteal bypass 04/18/2014   PONV (postoperative nausea and vomiting)    Retention of urine    Sepsis (HCC)    Status post bilateral cataract extraction     Surgical History: Past Surgical History:  Procedure Laterality Date   AAA s/p EVAR N/A 03/28/2014   CATARACT EXTRACTION W/ INTRAOCULAR LENS IMPLANT Bilateral    COARCTATION OF AORTA REPAIR     FEMORAL-POPLITEAL BYPASS GRAFT Right 04/18/2014   INTRAOPERATIVE CHOLANGIOGRAM  06/17/2023   Procedure: CHOLANGIOGRAM, INTRAOPERATIVE;  Surgeon: Jordis Laneta FALCON, MD;  Location: [facility] ORS;  Service: General;;   IR EMBO TUMOR ORGAN ISCHEMIA INFARCT INC GUIDE ROADMAPPING  11/01/2023   IR PERC CHOLECYSTOSTOMY  05/02/2023   IR RADIOLOGIST EVAL & MGMT  06/10/2023   IR RADIOLOGIST EVAL & MGMT  10/07/2023     Family History: Family History  Problem Relation Age of Onset   Diabetes Brother     Social History:  reports that he has never smoked. He has never been exposed to tobacco smoke. He has never used smokeless tobacco. He reports that he does not currently use alcohol. He reports that he does not use drugs.  Physical Exam: BP (!) 151/67   Pulse 66   Temp (!) 97.2  F (36.2 C) (Temporal)   Resp 18   SpO2 100%    Constitutional: Pleasantly confused Cardiovascular: Regular rate and rhythm Respiratory: Clear to auscultation bilaterally GI: Abdomen is soft, nontender, nondistended, no abdominal masses   Laboratory Data: Urine culture 10/29 with Klebsiella, treated with culture appropriate antibiotics  Assessment & Plan:   Comorbid 89 year old male with dementia, Foley dependent urinary retention, previously underwent prostate artery embolization with persistent retention.  Here today for HOLEP.  We discussed the risks and benefits of HoLEP at length.  The procedure requires general anesthesia and takes 1 to 2 hours, and a holmium laser is used to enucleate the prostate and push this tissue into the bladder.  A morcellator is then used to remove this tissue, which is sent for pathology.  The vast majority(>95%) of patients are able to discharge the same day with a catheter in place for 2 to 3 days, and will follow-up in clinic for a voiding trial.  We specifically discussed the risks of bleeding, infection, retrograde ejaculation, temporary urgency and urge incontinence, very low risk of long-term incontinence, urethral stricture/bladder neck contracture, pathologic evaluation of prostate tissue and possible detection of prostate cancer or other malignancy, and possible need for additional procedures.   HOLEP today  Redell Burnet, MD 02/14/2024  [facility] [address] [place], [place] [address] [phone]

## 2024-02-15 ENCOUNTER — Encounter: Payer: Self-pay | Admitting: Urology

## 2024-02-15 LAB — SURGICAL PATHOLOGY

## 2024-02-15 NOTE — Anesthesia Postprocedure Evaluation (Signed)
 Anesthesia Post Note  Patient: Geophysical Data Processor  Procedure(s) Performed: ENUCLEATION, PROSTATE, USING LASER, WITH MORCELLATION (Prostate)  Patient location during evaluation: PACU Anesthesia Type: General Level of consciousness: awake and alert Pain management: pain level controlled Vital Signs Assessment: post-procedure vital signs reviewed and stable Respiratory status: spontaneous breathing, nonlabored ventilation and respiratory function stable Cardiovascular status: blood pressure returned to baseline and stable Postop Assessment: no apparent nausea or vomiting Anesthetic complications: no   No notable events documented.   Last Vitals:  Vitals:   02/14/24 1030 02/14/24 1049  BP: (!) 168/67 (!) 175/75  Pulse: 66 65  Resp: 12 16  Temp:  (!) 36.2 C  SpO2: 99% 100%    Last Pain:  Vitals:   02/14/24 1049  TempSrc: Temporal  PainSc: 0-No pain                 Fairy POUR Jorgen Wolfinger

## 2024-02-16 ENCOUNTER — Ambulatory Visit (INDEPENDENT_AMBULATORY_CARE_PROVIDER_SITE_OTHER): Admitting: Physician Assistant

## 2024-02-16 ENCOUNTER — Encounter: Payer: Self-pay | Admitting: Physician Assistant

## 2024-02-16 ENCOUNTER — Telehealth: Payer: Self-pay

## 2024-02-16 VITALS — Wt 159.2 lb

## 2024-02-16 DIAGNOSIS — N401 Enlarged prostate with lower urinary tract symptoms: Secondary | ICD-10-CM

## 2024-02-16 DIAGNOSIS — N138 Other obstructive and reflux uropathy: Secondary | ICD-10-CM

## 2024-02-16 MED ORDER — CEPHALEXIN 250 MG PO CAPS
500.0000 mg | ORAL_CAPSULE | Freq: Once | ORAL | Status: AC
  Administered 2024-02-16: 500 mg via ORAL

## 2024-02-16 NOTE — Patient Instructions (Signed)
Congratulations on your recent HOLEP procedure! As discussed in clinic today, these are the three normal postoperative findings after this surgery: Burning or pain with urination: This typically resolves within 1 week of surgery. If you are still having significant pain with urination 10 days after surgery, please call our clinic. We may need to check you for a urinary tract infection at that point, though this is rare. Blood in the urine: This may either come and go or steadily improve before going away, but typically resolves completely within 3 weeks of surgery. If you are on blood thinners, it may take longer for the bleeding to resolve. Please note that you may find that you pass clumps of tissue or debris around 10-14 days after surgery associated with some new bleeding. This is due to sloughing of your postoperative scab and is also normal. If at any point you start to pass dark red urine; thick, ketchup-like urine; or large blood clots around the size of your palm, please call our office immediately. Urinary leakage or urgency: This tends to improve with time, with most patients becoming dry within around 3 months of surgery. You may wear absorbant underwear or liners for security during this time.

## 2024-02-16 NOTE — Telephone Encounter (Signed)
 Pts wife called she wanted to give SV and update on her husbands urine output.  Not sure if SV wants to see in thi afternoon.   She states he has had 3 cups of H20 and 1/2 cup of ginger ale. Urinated some in his underwear. She measured 100mg  since 10am.   SV informed. She states he can come in tomorrow at 11am for PVR.   Wife aware. Appt made.

## 2024-02-16 NOTE — Progress Notes (Signed)
 Catheter Removal  Patient is present today for a catheter removal.  56ml of water was drained from the balloon. A 24FR three-way foley cath was removed from the bladder, no complications were noted. Patient tolerated well.  Performed by: Olsen Mccutchan, PA-C   Additional notes: Counseled patient on normal postoperative findings including dysuria, gross hematuria, and urinary urgency/leakage. Counseled patient to wear absorbent products as needed for security. Written and verbal resources provided today. Surgical pathology benign; results shared with patient.  Keflex  500mg  x1 dose administered prior to Foley removal as above.   Follow up: Return in about 4 months (around 06/15/2024) for Postop f/u with Dr. Francisca.

## 2024-02-16 NOTE — Telephone Encounter (Signed)
 Wife LM on triage line that pt has had 300mg  of urine output today.

## 2024-02-17 ENCOUNTER — Ambulatory Visit: Admitting: Physician Assistant

## 2024-03-01 ENCOUNTER — Telehealth: Payer: Self-pay

## 2024-03-01 NOTE — Telephone Encounter (Signed)
 Pts wife called in with c/o of  patient having diarrhea since yesterday. She stated thinking it was from his surgery on 11/10. He has not been on any antibiotics since then. After confirming with PA we let her know we don't think this is related to his surgery but we did suggest some over the counter imodium but I did state if it persists he needs to follow up with his PCP. Wife voiced understanding.

## 2024-06-07 ENCOUNTER — Ambulatory Visit: Admitting: Urology
